# Patient Record
Sex: Female | Born: 1945 | Race: White | Hispanic: No | Marital: Married | Smoking: Former smoker
Health system: Southern US, Community
[De-identification: ages and names within clinical notes are randomized; demographics above are authoritative.]

## PROBLEM LIST (undated history)

## (undated) DIAGNOSIS — M19012 Primary osteoarthritis, left shoulder: Secondary | ICD-10-CM

## (undated) DIAGNOSIS — R110 Nausea: Secondary | ICD-10-CM

## (undated) DIAGNOSIS — R634 Abnormal weight loss: Secondary | ICD-10-CM

## (undated) DIAGNOSIS — R7989 Other specified abnormal findings of blood chemistry: Secondary | ICD-10-CM

## (undated) DIAGNOSIS — S52531A Colles' fracture of right radius, initial encounter for closed fracture: Secondary | ICD-10-CM

## (undated) DIAGNOSIS — D72829 Elevated white blood cell count, unspecified: Secondary | ICD-10-CM

## (undated) DIAGNOSIS — G9340 Encephalopathy, unspecified: Secondary | ICD-10-CM

## (undated) DIAGNOSIS — R8271 Bacteriuria: Secondary | ICD-10-CM

## (undated) DIAGNOSIS — E870 Hyperosmolality and hypernatremia: Secondary | ICD-10-CM

## (undated) DIAGNOSIS — A6920 Lyme disease, unspecified: Secondary | ICD-10-CM

## (undated) DIAGNOSIS — M7582 Other shoulder lesions, left shoulder: Secondary | ICD-10-CM

## (undated) DIAGNOSIS — N179 Acute kidney failure, unspecified: Secondary | ICD-10-CM

## (undated) DIAGNOSIS — F4310 Post-traumatic stress disorder, unspecified: Secondary | ICD-10-CM

## (undated) DIAGNOSIS — IMO0002 Reserved for concepts with insufficient information to code with codable children: Secondary | ICD-10-CM

## (undated) DIAGNOSIS — N949 Unspecified condition associated with female genital organs and menstrual cycle: Secondary | ICD-10-CM

## (undated) DIAGNOSIS — Z4802 Encounter for removal of sutures: Secondary | ICD-10-CM

## (undated) DIAGNOSIS — R197 Diarrhea, unspecified: Secondary | ICD-10-CM

## (undated) DIAGNOSIS — G8929 Other chronic pain: Secondary | ICD-10-CM

## (undated) DIAGNOSIS — F1120 Opioid dependence, uncomplicated: Secondary | ICD-10-CM

## (undated) DIAGNOSIS — IMO0001 Reserved for inherently not codable concepts without codable children: Secondary | ICD-10-CM

## (undated) DIAGNOSIS — F13239 Sedative, hypnotic or anxiolytic dependence with withdrawal, unspecified: Secondary | ICD-10-CM

## (undated) DIAGNOSIS — R102 Pelvic and perineal pain: Secondary | ICD-10-CM

## (undated) HISTORY — DX: Colles' fracture of right radius, initial encounter for closed fracture: S52.531A

## (undated) HISTORY — PX: COLON SURGERY: SHX602

## (undated) HISTORY — DX: Lyme disease, unspecified: A69.20

## (undated) HISTORY — DX: Pelvic and perineal pain: R10.2

## (undated) HISTORY — DX: Elevated white blood cell count, unspecified: D72.829

## (undated) HISTORY — DX: Unspecified condition associated with female genital organs and menstrual cycle: N94.9

## (undated) HISTORY — DX: Encephalopathy, unspecified: G93.40

## (undated) HISTORY — DX: Diarrhea, unspecified: R19.7

## (undated) HISTORY — DX: Other specified abnormal findings of blood chemistry: R79.89

## (undated) HISTORY — DX: Reserved for concepts with insufficient information to code with codable children: IMO0002

## (undated) HISTORY — DX: Bacteriuria: R82.71

## (undated) HISTORY — DX: Abnormal weight loss: R63.4

## (undated) HISTORY — DX: Other shoulder lesions, left shoulder: M75.82

## (undated) HISTORY — PX: APPENDECTOMY: SHX54

## (undated) HISTORY — DX: Opioid dependence, uncomplicated: F11.20

## (undated) HISTORY — DX: Reserved for inherently not codable concepts without codable children: IMO0001

## (undated) HISTORY — DX: Acute kidney failure, unspecified: N17.9

## (undated) HISTORY — DX: Nausea: R11.0

## (undated) HISTORY — DX: Post-traumatic stress disorder, unspecified: F43.10

## (undated) HISTORY — DX: Encounter for removal of sutures: Z48.02

## (undated) HISTORY — DX: Primary osteoarthritis, left shoulder: M19.012

## (undated) HISTORY — DX: Hyperosmolality and hypernatremia: E87.0

## (undated) HISTORY — DX: Other chronic pain: G89.29

## (undated) HISTORY — DX: Sedative, hypnotic or anxiolytic dependence with withdrawal, unspecified: F13.239

---

## 2010-11-01 DIAGNOSIS — N949 Unspecified condition associated with female genital organs and menstrual cycle: Secondary | ICD-10-CM

## 2010-11-01 HISTORY — DX: Unspecified condition associated with female genital organs and menstrual cycle: N94.9

## 2012-12-15 DIAGNOSIS — F1120 Opioid dependence, uncomplicated: Secondary | ICD-10-CM | POA: Insufficient documentation

## 2012-12-15 DIAGNOSIS — A6920 Lyme disease, unspecified: Secondary | ICD-10-CM

## 2012-12-15 DIAGNOSIS — IMO0002 Reserved for concepts with insufficient information to code with codable children: Secondary | ICD-10-CM

## 2012-12-15 HISTORY — DX: Reserved for concepts with insufficient information to code with codable children: IMO0002

## 2012-12-15 HISTORY — DX: Opioid dependence, uncomplicated: F11.20

## 2012-12-15 HISTORY — DX: Lyme disease, unspecified: A69.20

## 2013-03-16 DIAGNOSIS — R634 Abnormal weight loss: Secondary | ICD-10-CM

## 2013-03-16 HISTORY — DX: Abnormal weight loss: R63.4

## 2013-07-01 DIAGNOSIS — IMO0001 Reserved for inherently not codable concepts without codable children: Secondary | ICD-10-CM

## 2013-07-01 HISTORY — DX: Reserved for inherently not codable concepts without codable children: IMO0001

## 2013-07-13 DIAGNOSIS — Z4802 Encounter for removal of sutures: Secondary | ICD-10-CM

## 2013-07-13 HISTORY — DX: Encounter for removal of sutures: Z48.02

## 2014-04-20 DIAGNOSIS — R110 Nausea: Secondary | ICD-10-CM

## 2014-04-20 HISTORY — DX: Nausea: R11.0

## 2014-09-11 DIAGNOSIS — M7582 Other shoulder lesions, left shoulder: Secondary | ICD-10-CM | POA: Insufficient documentation

## 2014-09-11 DIAGNOSIS — M19012 Primary osteoarthritis, left shoulder: Secondary | ICD-10-CM

## 2014-09-11 DIAGNOSIS — M778 Other enthesopathies, not elsewhere classified: Secondary | ICD-10-CM

## 2014-09-11 DIAGNOSIS — S52531A Colles' fracture of right radius, initial encounter for closed fracture: Secondary | ICD-10-CM

## 2014-09-11 HISTORY — DX: Other enthesopathies, not elsewhere classified: M77.8

## 2014-09-11 HISTORY — DX: Colles' fracture of right radius, initial encounter for closed fracture: S52.531A

## 2014-09-11 HISTORY — DX: Primary osteoarthritis, left shoulder: M19.012

## 2016-10-03 ENCOUNTER — Ambulatory Visit
Admission: EM | Admit: 2016-10-03 | Discharge: 2016-10-03 | Disposition: A | Discharge status: Home / Self Care | Payer: Medicare Other | Attending: Family Medicine | Admitting: Family Medicine

## 2016-10-03 ENCOUNTER — Ambulatory Visit (INDEPENDENT_AMBULATORY_CARE_PROVIDER_SITE_OTHER): Payer: Medicare Other

## 2016-10-03 ENCOUNTER — Encounter: Payer: Self-pay | Admitting: *Deleted

## 2016-10-03 DIAGNOSIS — R531 Weakness: Secondary | ICD-10-CM | POA: Diagnosis not present

## 2016-10-03 DIAGNOSIS — R911 Solitary pulmonary nodule: Secondary | ICD-10-CM

## 2016-10-03 NOTE — ED Triage Notes (Signed)
Pt recently dx with pneumonia by her PCP and given Azithromycin which has caused diarrhea. Pt has had multiple abd surgeries which have made bowel movements difficult for her. She is here as her PCP suggested she come here for IV antibiotics.
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
## 2016-10-07 ENCOUNTER — Other Ambulatory Visit: Payer: Self-pay | Admitting: *Deleted

## 2016-10-07 DIAGNOSIS — R9389 Abnormal findings on diagnostic imaging of other specified body structures: Secondary | ICD-10-CM

## 2016-10-23 ENCOUNTER — Ambulatory Visit: Payer: Medicare Other

## 2017-03-27 DIAGNOSIS — D72829 Elevated white blood cell count, unspecified: Secondary | ICD-10-CM

## 2017-03-27 DIAGNOSIS — R8271 Bacteriuria: Secondary | ICD-10-CM | POA: Insufficient documentation

## 2017-03-27 DIAGNOSIS — R102 Pelvic and perineal pain: Secondary | ICD-10-CM

## 2017-03-27 DIAGNOSIS — R778 Other specified abnormalities of plasma proteins: Secondary | ICD-10-CM

## 2017-03-27 DIAGNOSIS — G9340 Encephalopathy, unspecified: Secondary | ICD-10-CM

## 2017-03-27 DIAGNOSIS — R7989 Other specified abnormal findings of blood chemistry: Secondary | ICD-10-CM

## 2017-03-27 DIAGNOSIS — G8929 Other chronic pain: Secondary | ICD-10-CM

## 2017-03-27 DIAGNOSIS — E870 Hyperosmolality and hypernatremia: Secondary | ICD-10-CM

## 2017-03-27 DIAGNOSIS — N179 Acute kidney failure, unspecified: Secondary | ICD-10-CM

## 2017-03-27 HISTORY — DX: Other chronic pain: G89.29

## 2017-03-27 HISTORY — DX: Pelvic and perineal pain: R10.2

## 2017-03-27 HISTORY — DX: Bacteriuria: R82.71

## 2017-03-27 HISTORY — DX: Acute kidney failure, unspecified: N17.9

## 2017-03-27 HISTORY — DX: Other specified abnormalities of plasma proteins: R77.8

## 2017-03-27 HISTORY — DX: Elevated white blood cell count, unspecified: D72.829

## 2017-03-27 HISTORY — DX: Encephalopathy, unspecified: G93.40

## 2017-03-27 HISTORY — DX: Hyperosmolality and hypernatremia: E87.0

## 2017-03-30 DIAGNOSIS — F13939 Sedative, hypnotic or anxiolytic use, unspecified with withdrawal, unspecified: Secondary | ICD-10-CM

## 2017-03-30 DIAGNOSIS — F13239 Sedative, hypnotic or anxiolytic dependence with withdrawal, unspecified: Secondary | ICD-10-CM

## 2017-03-30 HISTORY — DX: Sedative, hypnotic or anxiolytic use, unspecified with withdrawal, unspecified: F13.939

## 2017-03-30 HISTORY — DX: Sedative, hypnotic or anxiolytic dependence with withdrawal, unspecified: F13.239

## 2017-06-15 DIAGNOSIS — R197 Diarrhea, unspecified: Secondary | ICD-10-CM

## 2017-06-15 HISTORY — DX: Diarrhea, unspecified: R19.7

## 2017-12-08 ENCOUNTER — Emergency Department: Payer: Medicare Other

## 2017-12-08 ENCOUNTER — Emergency Department
Admission: EM | Admit: 2017-12-08 | Discharge: 2017-12-08 | Disposition: A | Discharge status: Home / Self Care | Payer: Medicare Other | Attending: Emergency Medicine | Admitting: Emergency Medicine

## 2017-12-08 DIAGNOSIS — Y999 Unspecified external cause status: Secondary | ICD-10-CM | POA: Diagnosis not present

## 2017-12-08 DIAGNOSIS — Y929 Unspecified place or not applicable: Secondary | ICD-10-CM | POA: Diagnosis not present

## 2017-12-08 DIAGNOSIS — G8929 Other chronic pain: Secondary | ICD-10-CM

## 2017-12-08 DIAGNOSIS — Y9389 Activity, other specified: Secondary | ICD-10-CM | POA: Diagnosis not present

## 2017-12-08 DIAGNOSIS — M25512 Pain in left shoulder: Secondary | ICD-10-CM | POA: Insufficient documentation

## 2017-12-08 DIAGNOSIS — S4992XA Unspecified injury of left shoulder and upper arm, initial encounter: Secondary | ICD-10-CM | POA: Diagnosis present

## 2017-12-08 DIAGNOSIS — W108XXA Fall (on) (from) other stairs and steps, initial encounter: Secondary | ICD-10-CM | POA: Diagnosis not present

## 2017-12-08 DIAGNOSIS — W19XXXA Unspecified fall, initial encounter: Secondary | ICD-10-CM

## 2017-12-08 LAB — BASIC METABOLIC PANEL
Anion gap: 7 (ref 5–15)
BUN: 18 mg/dL (ref 8–23)
CHLORIDE: 107 mmol/L (ref 98–111)
CO2: 27 mmol/L (ref 22–32)
Calcium: 8.6 mg/dL — ABNORMAL LOW (ref 8.9–10.3)
Creatinine, Ser: 0.87 mg/dL (ref 0.44–1.00)
GFR calc Af Amer: >60 mL/min (ref >60)
GLUCOSE: 99 mg/dL (ref 70–99)
Potassium: 4.6 mmol/L (ref 3.5–5.1)
Sodium: 141 mmol/L (ref 135–145)

## 2017-12-08 LAB — ETHANOL

## 2017-12-08 LAB — CBC WITH DIFFERENTIAL/PLATELET
Basophils Absolute: 0 10*3/uL (ref 0–0.1)
Basophils Relative: 1 %
EOS ABS: 0.2 10*3/uL (ref 0–0.7)
Eosinophils Relative: 3 %
HCT: 38.1 % (ref 35.0–47.0)
HEMOGLOBIN: 13.1 g/dL (ref 12.0–16.0)
LYMPHS ABS: 1.4 10*3/uL (ref 1.0–3.6)
Lymphocytes Relative: 19 %
MCH: 31.1 pg (ref 26.0–34.0)
MCHC: 34.3 g/dL (ref 32.0–36.0)
MCV: 90.7 fL (ref 80.0–100.0)
MONOS PCT: 8 %
Monocytes Absolute: 0.5 10*3/uL (ref 0.2–0.9)
NEUTROS PCT: 69 %
Neutro Abs: 5.2 10*3/uL (ref 1.4–6.5)
Platelets: 239 10*3/uL (ref 150–440)
RBC: 4.2 MIL/uL (ref 3.80–5.20)
RDW: 13.9 % (ref 11.5–14.5)
WBC: 7.4 10*3/uL (ref 3.6–11.0)

## 2017-12-08 LAB — SALICYLATE LEVEL: Salicylate Lvl: <7 mg/dL (ref 2.8–30.0)

## 2017-12-08 LAB — ACETAMINOPHEN LEVEL: Acetaminophen (Tylenol), Serum: <10 ug/mL — ABNORMAL LOW (ref 10–30)

## 2017-12-08 LAB — TROPONIN I

## 2017-12-08 MED ORDER — ONDANSETRON HCL 4 MG/2ML IJ SOLN
4.0000 mg | Freq: Once | INTRAMUSCULAR | Status: AC
  Administered 2017-12-08: 4 mg via INTRAVENOUS
  Filled 2017-12-08: qty 2

## 2017-12-08 MED ORDER — MORPHINE SULFATE (PF) 4 MG/ML IV SOLN
4.0000 mg | Freq: Once | INTRAVENOUS | Status: AC
  Administered 2017-12-08: 4 mg via INTRAVENOUS
  Filled 2017-12-08: qty 1

## 2017-12-08 NOTE — ED Notes (Signed)
Per Dr. Pershing Proud pt does not need to be dressed out with sitter at this time.

## 2017-12-08 NOTE — ED Triage Notes (Signed)
Pt brought to ED via EMS. Pt reports falling on Friday. States she popped her left shoulder back into place herself. Pt now complaining of left shoulder and back pain. Pt normally ambulatory and lives at home alone. EMS vitals 148/95 HR 108 96%RA.

## 2017-12-08 NOTE — ED Provider Notes (Signed)
[facility] Emergency Department Provider Note ____________________________________________   First MD Initiated Contact with Patient 12/08/17 1024     (approximate)  I have reviewed the triage vital signs and the nursing notes.   HISTORY  Chief Complaint Fall  HPI Sierra Dickerson is a 72 y.o. female with a history of left shoulder dislocation and subsequent surgery who was presented to the emergency department after having fallen this past Friday night, 5 days ago.  She says that she tripped and fell down 15 stairs.  She says that she thinks she dislocated her shoulder at that time was able to pop it back in.  Denies any headache or neck pain at this time.  However, is complaining of her generalized chronic pain.  Says that she takes an oxycodone at home without relief and came to the emergency department today for worsening pain to her left shoulder and increasing immobility to the left shoulder.  Says that she lives alone.  Says that she also has fleeting thoughts of suicide but no plan.   No past medical history on file.  There are no active problems to display for this patient.   Past Surgical History:  Procedure Laterality Date  . APPENDECTOMY    . COLON SURGERY      Prior to Admission medications   Not on File    Allergies Nsaids  No family history on file.  Social History Social History   Tobacco Use  . Smoking status: Never Smoker  . Smokeless tobacco: Never Used  Substance Use Topics  . Alcohol use: Yes  . Drug use: No    Review of Systems  Constitutional: No fever/chills Eyes: No visual changes. ENT: No sore throat. Cardiovascular: Denies chest pain. Respiratory: Denies shortness of breath. Gastrointestinal: No abdominal pain.  No nausea, no vomiting.  No diarrhea.  No constipation. Genitourinary: Negative for dysuria. Musculoskeletal: Negative for back pain. Skin: Negative for rash. Neurological: Negative for headaches,  focal weakness or numbness.   ____________________________________________   PHYSICAL EXAM:  VITAL SIGNS: ED Triage Vitals  Enc Vitals Group     BP 12/08/17 1018 (!) 144/56     Pulse Rate 12/08/17 1018 69     Resp 12/08/17 1018 18     Temp 12/08/17 1018 97.7 F (36.5 C)     Temp Source 12/08/17 1018 Oral     SpO2 12/08/17 1018 98 %     Weight --      Height --      Head Circumference --      Peak Flow --      Pain Score 12/08/17 1019 9     Pain Loc --      Pain Edu? --      Excl. in GC? --     Constitutional: Alert and oriented.  Patient appears uncomfortable, lying on her right side.  Left upper extremity is held in adduction with internal rotation. Eyes: Conjunctivae are normal.  Head: Atraumatic. Nose: No congestion/rhinnorhea. Mouth/Throat: Mucous membranes are moist.  Neck: No stridor.   Cardiovascular: Normal rate, regular rhythm. Grossly normal heart sounds.  Good peripheral circulation with equal bilateral radial pulses. Respiratory: Normal respiratory effort.  No retractions. Lungs CTAB. Gastrointestinal: Soft and nontender. No distention. No CVA tenderness. Musculoskeletal: No lower extremity tenderness nor edema.  No joint effusions. Able to passively range left upper extremity/shoulder.  No issues with range of motion of the left elbow but patient with pain with passive range of motion  of the left shoulder.  No deformity visualized.  No tenderness to palpation.  No ecchymosis.  Patient with intact sensation to light touch over the left deltoid. Neurologic:  Normal speech and language. No gross focal neurologic deficits are appreciated. Skin:  Skin is warm, dry and intact. No rash noted. Psychiatric: Mood and affect are normal. Speech and behavior are normal.  ____________________________________________   LABS (all labs ordered are listed, but only abnormal results are displayed)  Labs Reviewed  BASIC METABOLIC PANEL - Abnormal; Notable for the following  components:      Result Value   Calcium 8.6 (*)    All other components within normal limits  ACETAMINOPHEN LEVEL - Abnormal; Notable for the following components:   Acetaminophen (Tylenol), Serum <10 (*)    All other components within normal limits  CBC WITH DIFFERENTIAL/PLATELET  TROPONIN I  SALICYLATE LEVEL  ETHANOL  URINALYSIS, COMPLETE (UACMP) WITH MICROSCOPIC  URINE DRUG SCREEN, QUALITATIVE ([facility] ONLY)   ____________________________________________  EKG  ED ECG REPORT I, Arelia Longest, the attending physician, personally viewed and interpreted this ECG.   Date: 12/08/2017  EKG Time: 1115  Rate: 63  Rhythm: normal sinus rhythm  Axis: Normal  Intervals:nonspecific intraventricular conduction delay  ST&T Change: No ST segment elevation or depression.  T wave inversions in 3, aVF with biphasic T waves in V5 and V6. No previous EKG for comparison. ____________________________________________  RADIOLOGY  Left shoulder without any acute findings.  Chest x-ray without any acute disease. ____________________________________________   PROCEDURES  Procedure(s) performed:   Procedures  Critical Care performed:   ____________________________________________   INITIAL IMPRESSION / ASSESSMENT AND PLAN / ED COURSE  Pertinent labs & imaging results that were available during my care of the patient were reviewed by me and considered in my medical decision making (see chart for details).  DDX: Chronic pain, depression, suicidal ideation, chronic left shoulder pain, injury from fall, contusion As part of my medical decision making, I reviewed the following data within the electronic MEDICAL RECORD NUMBERReviewed notes from prior outpatient visits  ----------------------------------------- 12:20 PM on 12/08/2017 -----------------------------------------  Patient at this time is sitting at the side of the bed.  No distress.  Saying that she would like to be discharged home.   I asked her again about her depression and possible fleeting suicidal thoughts and she says that she is feels fine right now but sometimes gets upset about her chronic pain but does not have any intention or plans to harm herself or kill herself or anybody else.  She says that she feels confident about being discharged and following up with her primary care doctor.  Will be discharged at this time.  We reviewed her labs as well as imaging studies. ____________________________________________   FINAL CLINICAL IMPRESSION(S) / ED DIAGNOSES  Fall.  Chronic pain.  NEW MEDICATIONS STARTED DURING THIS VISIT:  New Prescriptions   No medications on file     Note:  This document was prepared using Dragon voice recognition software and may include unintentional dictation errors.     Myrna Blazer, MD 12/08/17 [phone]

## 2017-12-08 NOTE — ED Notes (Signed)
Pt refusing to sign ED discharge note. Pt in hallway stating she is leaving now. Was able to talk patient into returning to room for doctor to see and handed pt discharge paperwork and reviewed prior to discharge. Per Dr. Pershing Proud pt is allowed to take a cab home. Pt did not drive here today.

## 2018-03-10 DIAGNOSIS — F4310 Post-traumatic stress disorder, unspecified: Secondary | ICD-10-CM

## 2018-03-10 HISTORY — DX: Post-traumatic stress disorder, unspecified: F43.10

## 2018-06-04 ENCOUNTER — Encounter: Payer: Self-pay | Admitting: Urology

## 2018-06-04 ENCOUNTER — Other Ambulatory Visit
Admission: RE | Admit: 2018-06-04 | Discharge: 2018-06-04 | Disposition: A | Discharge status: Home / Self Care | Payer: Medicare Other | Attending: Urology | Admitting: Urology

## 2018-06-04 ENCOUNTER — Ambulatory Visit: Payer: Medicare Other | Admitting: Urology

## 2018-06-04 ENCOUNTER — Telehealth: Payer: Self-pay | Admitting: Urology

## 2018-06-04 VITALS — BP 143/91 | HR 80 | Ht 62.5 in | Wt 130.0 lb

## 2018-06-04 DIAGNOSIS — N369 Urethral disorder, unspecified: Secondary | ICD-10-CM | POA: Diagnosis not present

## 2018-06-04 DIAGNOSIS — R102 Pelvic and perineal pain: Secondary | ICD-10-CM

## 2018-06-04 DIAGNOSIS — R3129 Other microscopic hematuria: Secondary | ICD-10-CM

## 2018-06-04 DIAGNOSIS — N361 Urethral diverticulum: Secondary | ICD-10-CM | POA: Insufficient documentation

## 2018-06-04 LAB — URINALYSIS, COMPLETE (UACMP) WITH MICROSCOPIC
Bacteria, UA: NONE SEEN
Bilirubin Urine: NEGATIVE
Glucose, UA: NEGATIVE mg/dL
Ketones, ur: NEGATIVE mg/dL
Leukocytes,Ua: NEGATIVE
Nitrite: NEGATIVE
Protein, ur: NEGATIVE mg/dL
SPECIFIC GRAVITY, URINE: 1.015 (ref 1.005–1.030)
WBC UA: NONE SEEN WBC/hpf (ref 0–5)
pH: 7 (ref 5.0–8.0)

## 2018-06-04 NOTE — Telephone Encounter (Signed)
Pt called very upset and stated that she seen Dr Sierra Dickerson and she doesn't understand what Dr Sierra Dickerson told her today. She is requesting that someone call her and also send her some instructions through the mail. She stated she did not get an AVS.
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
## 2018-06-07 NOTE — Telephone Encounter (Signed)
Called patient and went over visit details, office note was mailed to patient for review

## 2018-06-10 ENCOUNTER — Other Ambulatory Visit: Payer: Self-pay

## 2018-06-10 DIAGNOSIS — R3129 Other microscopic hematuria: Secondary | ICD-10-CM

## 2018-06-10 DIAGNOSIS — N369 Urethral disorder, unspecified: Secondary | ICD-10-CM

## 2018-06-10 DIAGNOSIS — R102 Pelvic and perineal pain: Secondary | ICD-10-CM

## 2018-06-15 ENCOUNTER — Ambulatory Visit
Admission: RE | Admit: 2018-06-15 | Discharge: 2018-06-15 | Disposition: A | Discharge status: Home / Self Care | Payer: Medicare Other | Source: Ambulatory Visit | Attending: Urology | Admitting: Urology

## 2018-06-15 ENCOUNTER — Telehealth: Payer: Self-pay

## 2018-06-15 ENCOUNTER — Other Ambulatory Visit: Payer: Self-pay

## 2018-06-15 ENCOUNTER — Other Ambulatory Visit
Admission: RE | Admit: 2018-06-15 | Discharge: 2018-06-15 | Disposition: A | Discharge status: Home / Self Care | Payer: Medicare Other | Source: Home / Self Care | Attending: Urology | Admitting: Urology

## 2018-06-15 DIAGNOSIS — N369 Urethral disorder, unspecified: Secondary | ICD-10-CM | POA: Insufficient documentation

## 2018-06-15 DIAGNOSIS — R102 Pelvic and perineal pain: Secondary | ICD-10-CM

## 2018-06-15 DIAGNOSIS — R3129 Other microscopic hematuria: Secondary | ICD-10-CM

## 2018-06-15 LAB — CREATININE, SERUM: Creatinine, Ser: 0.86 mg/dL (ref 0.44–1.00)

## 2018-06-15 MED ORDER — GADOBUTROL 1 MMOL/ML IV SOLN
5.0000 mL | Freq: Once | INTRAVENOUS | Status: AC | PRN
  Administered 2018-06-15: 5 mL via INTRAVENOUS

## 2018-06-15 NOTE — Telephone Encounter (Signed)
Left pt mess to call 

## 2018-06-15 NOTE — Telephone Encounter (Signed)
Patient notified

## 2018-06-15 NOTE — Addendum Note (Signed)
Addended by: Caroline Sauger on: 06/15/2018 08:19 AM   Modules accepted: Orders

## 2018-06-15 NOTE — Telephone Encounter (Signed)
-----   Message from Vanna Scotland, MD sent at 06/15/2018  1:44 PM EDT ----- Please let this patient know that I have reviewed her MRI.  There is no evidence of a urethral cyst or diverticulum (outpouching of the urethra).  Recommend following up with the urologist that I recommended at [facility], Dr. Nino Parsley for further assessment.  Vanna Scotland, MD

## 2018-06-25 ENCOUNTER — Ambulatory Visit: Payer: Self-pay | Admitting: Urology

## 2018-07-14 ENCOUNTER — Telehealth: Payer: Self-pay | Admitting: *Deleted

## 2018-07-14 NOTE — Telephone Encounter (Signed)
FYI-Received fax from [facility] regarding referral-patient did not wish to undergo evaluation at their office.

## 2019-01-15 ENCOUNTER — Other Ambulatory Visit: Payer: Self-pay

## 2019-01-15 ENCOUNTER — Emergency Department
Admission: EM | Admit: 2019-01-15 | Discharge: 2019-01-15 | Disposition: A | Discharge status: Home / Self Care | Payer: Medicare Other | Attending: Student in an Organized Health Care Education/Training Program | Admitting: Student in an Organized Health Care Education/Training Program

## 2019-01-15 ENCOUNTER — Encounter: Payer: Self-pay | Admitting: Emergency Medicine

## 2019-01-15 DIAGNOSIS — Z87891 Personal history of nicotine dependence: Secondary | ICD-10-CM | POA: Insufficient documentation

## 2019-01-15 DIAGNOSIS — Z79899 Other long term (current) drug therapy: Secondary | ICD-10-CM | POA: Diagnosis not present

## 2019-01-15 DIAGNOSIS — R6889 Other general symptoms and signs: Secondary | ICD-10-CM

## 2019-01-15 DIAGNOSIS — R7989 Other specified abnormal findings of blood chemistry: Secondary | ICD-10-CM | POA: Insufficient documentation

## 2019-01-15 LAB — CBC WITH DIFFERENTIAL/PLATELET
Abs Immature Granulocytes: 0.02 10*3/uL (ref 0.00–0.07)
Basophils Absolute: 0.1 10*3/uL (ref 0.0–0.1)
Basophils Relative: 1 %
Eosinophils Absolute: 0.1 10*3/uL (ref 0.0–0.5)
Eosinophils Relative: 1 %
HCT: 37.9 % (ref 36.0–46.0)
Hemoglobin: 12.6 g/dL (ref 12.0–15.0)
Immature Granulocytes: 0 %
Lymphocytes Relative: 19 %
Lymphs Abs: 1.7 10*3/uL (ref 0.7–4.0)
MCH: 30.3 pg (ref 26.0–34.0)
MCHC: 33.2 g/dL (ref 30.0–36.0)
MCV: 91.1 fL (ref 80.0–100.0)
Monocytes Absolute: 0.5 10*3/uL (ref 0.1–1.0)
Monocytes Relative: 5 %
Neutro Abs: 6.7 10*3/uL (ref 1.7–7.7)
Neutrophils Relative %: 74 %
Platelets: 246 10*3/uL (ref 150–400)
RBC: 4.16 MIL/uL (ref 3.87–5.11)
RDW: 11.9 % (ref 11.5–15.5)
WBC: 9.1 10*3/uL (ref 4.0–10.5)
nRBC: 0 % (ref 0.0–0.2)

## 2019-01-15 LAB — COMPREHENSIVE METABOLIC PANEL
ALT: 15 U/L (ref 0–44)
AST: 22 U/L (ref 15–41)
Albumin: 4 g/dL (ref 3.5–5.0)
Alkaline Phosphatase: 66 U/L (ref 38–126)
Anion gap: 9 (ref 5–15)
BUN: 24 mg/dL — ABNORMAL HIGH (ref 8–23)
CO2: 23 mmol/L (ref 22–32)
Calcium: 9.3 mg/dL (ref 8.9–10.3)
Chloride: 109 mmol/L (ref 98–111)
Creatinine, Ser: 0.79 mg/dL (ref 0.44–1.00)
GFR calc Af Amer: >60 mL/min (ref >60)
GFR calc non Af Amer: >60 mL/min (ref >60)
Glucose, Bld: 110 mg/dL — ABNORMAL HIGH (ref 70–99)
Potassium: 3.9 mmol/L (ref 3.5–5.1)
Sodium: 141 mmol/L (ref 135–145)
Total Bilirubin: 0.6 mg/dL (ref 0.3–1.2)
Total Protein: 7.4 g/dL (ref 6.5–8.1)

## 2019-01-15 LAB — TSH: TSH: 1.778 u[IU]/mL (ref 0.350–4.500)

## 2019-01-15 LAB — T4, FREE: Free T4: 0.85 ng/dL (ref 0.61–1.12)

## 2019-01-15 NOTE — ED Notes (Signed)
Spoke with Dr. Joan Mayans regarding pt. Verbal orders given for CBC, CMP, TSH, and free T4

## 2019-01-15 NOTE — Discharge Instructions (Addendum)
Please follow-up with PCP.  Discuss with your herbalist pharmacist your diet and medications which may contain excessive iodine.  Return to the ER for any additional questions or concerns.

## 2019-01-15 NOTE — ED Triage Notes (Signed)
Pt to ED via [facility] from home for abnormal lab results related to her thyroid. Pt states that her PCP ordered lab work last month and her iodine was 466. Pt states that she does not have an endocrinologist but states that her PCP told her that she needed to come in and be stabalized right away. Pt states that is having extreme fatigue, her hair is falling out. Pt states that she is gaining a lot of weight but she is not eating. Pt is currently in NAD.

## 2019-01-15 NOTE — ED Notes (Signed)
E-signature pad not working. Patient discharged in wheelchair to lobby to call for a ride.
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
## 2020-08-11 IMAGING — MR MRI PELVIS WITHOUT AND WITH CONTRAST
6 of 8 series · 17 of 48 positions shown · IV contrast (gadavist)
Comparison: None.

CLINICAL DATA: Difficulty emptying bladder. Clinical concern for
urethral diverticulum.

EXAM:
MRI PELVIS WITHOUT AND WITH CONTRAST
TECHNIQUE: Multiplanar multisequence MR imaging of the pelvis was performed
both before and after administration of intravenous contrast.
CONTRAST:  5 cc Gadavist

[Series 2: T2 · axial · 5.0mm · 0.55mm/px · z∈[-190,+44]mm · 3 of 40 slices shown (1 of 2)]
[im 1/40]
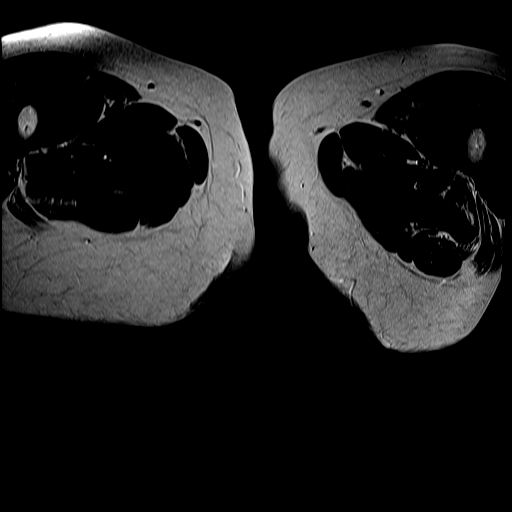
[im 20/40]
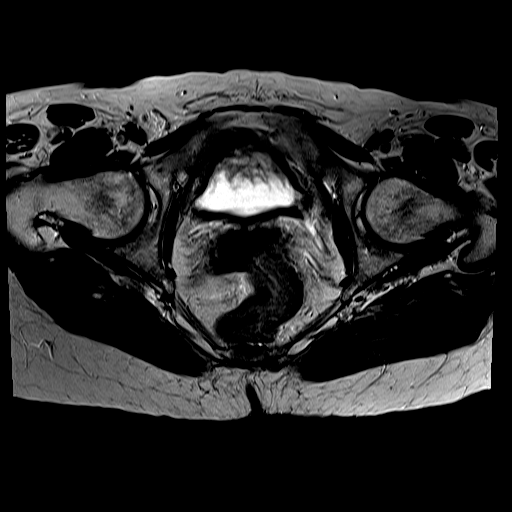
[im 40/40]
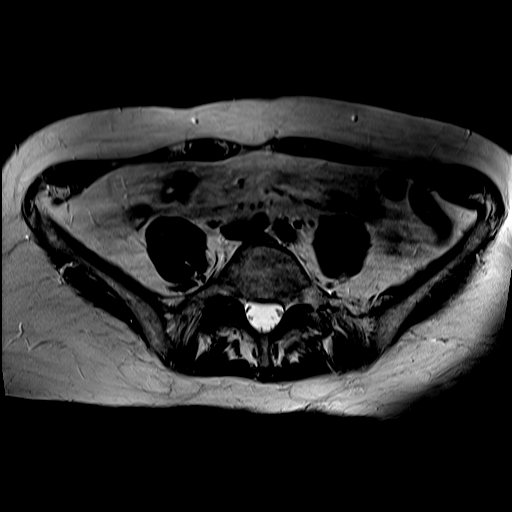

[Series 3: T2 · sagittal · 5.0mm · 0.55mm/px · 3 of 33 slices shown (2 of 2)]
[im 1/33]
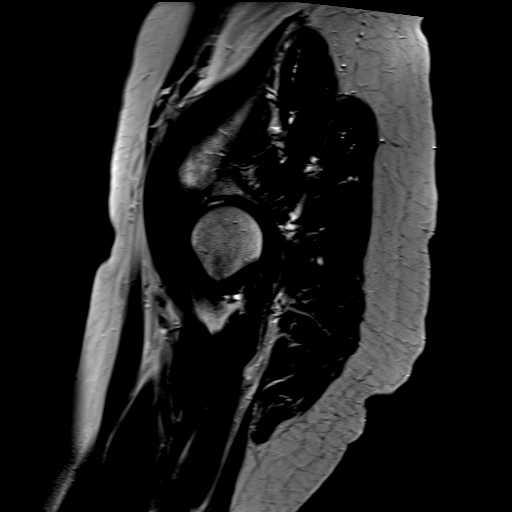
[im 17/33]
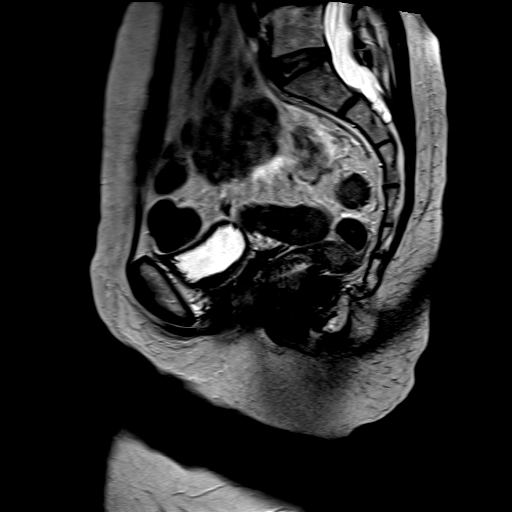
[im 33/33]
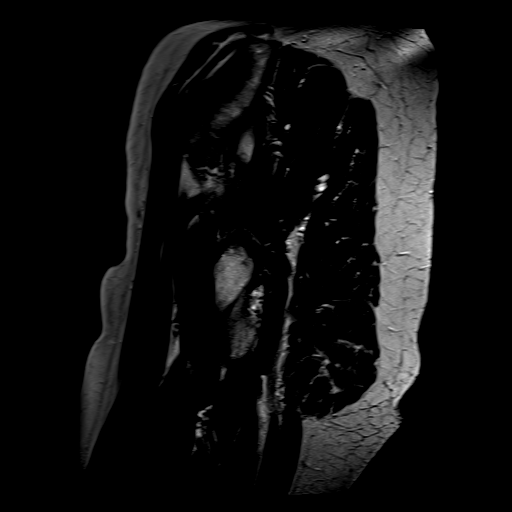

[Series 5: T2 fat-sat · axial · 4.0mm · 0.47mm/px · z∈[-158,-34]mm · 3 of 32 slices shown (1 of 3)]
[im 1/32]
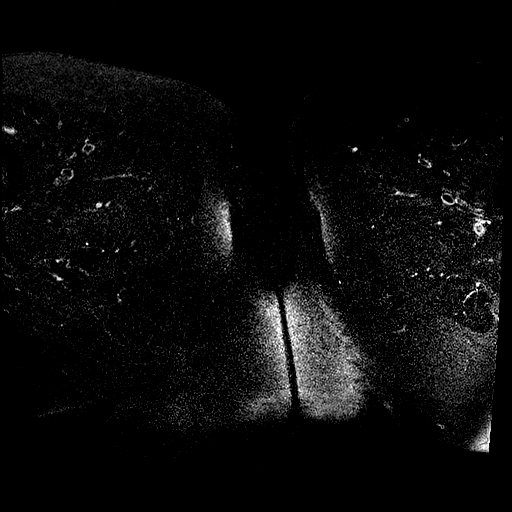
[im 16/32]
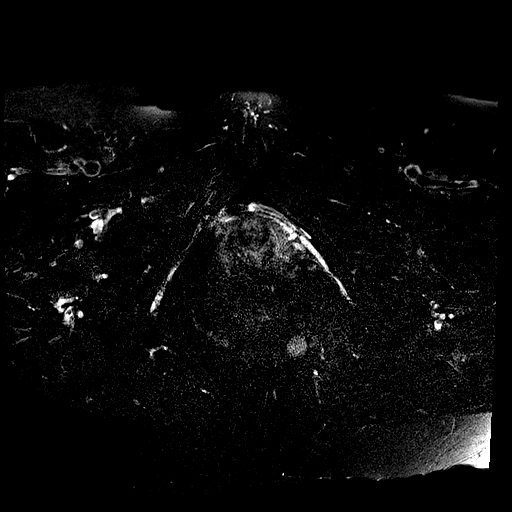
[im 32/32]
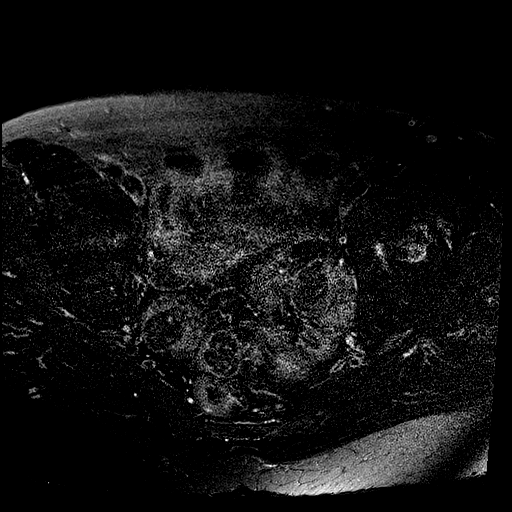

[Series 6: T2 fat-sat · sagittal · 4.0mm · 0.47mm/px · 3 of 37 slices shown (2 of 3)]
[im 1/37]
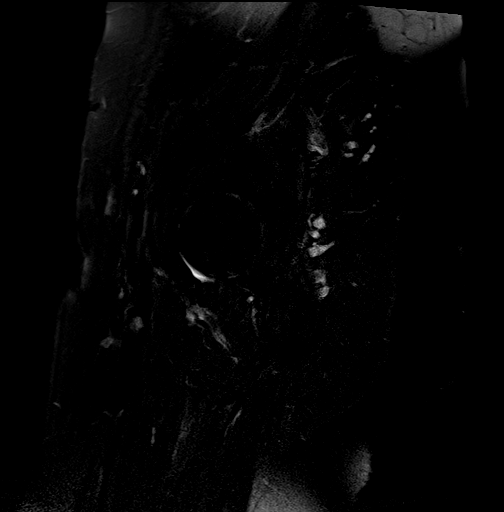
[im 19/37]
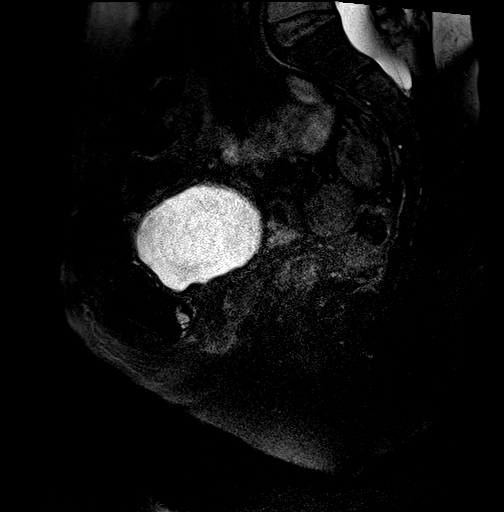
[im 37/37]
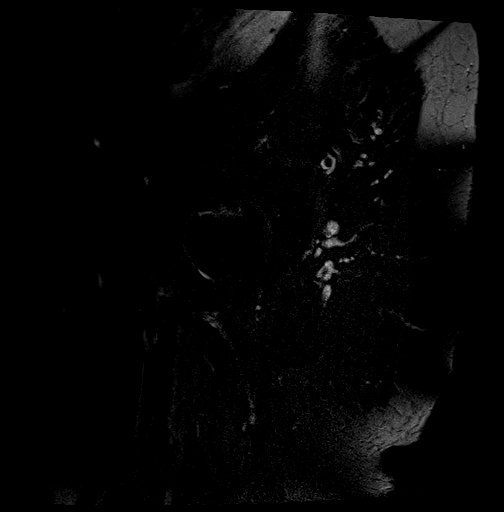

[Series 7: T2 fat-sat · coronal · 4.0mm · 0.47mm/px · 3 of 34 slices shown (3 of 3)]
[im 1/34]
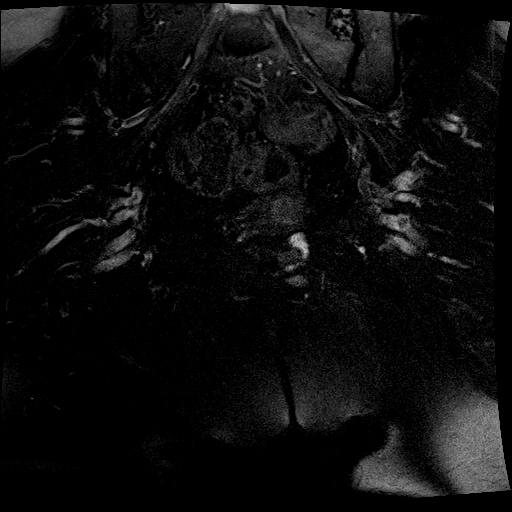
[im 17/34]
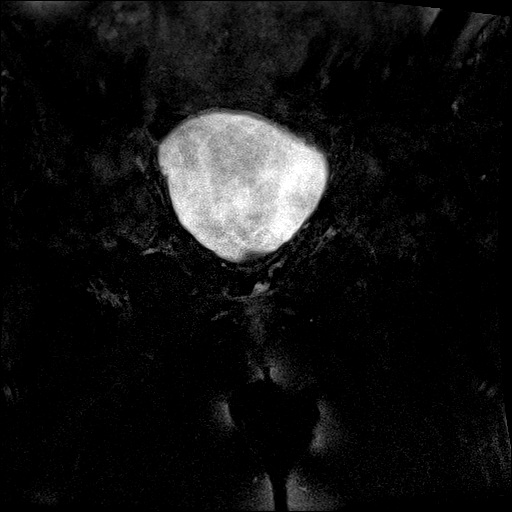
[im 34/34]
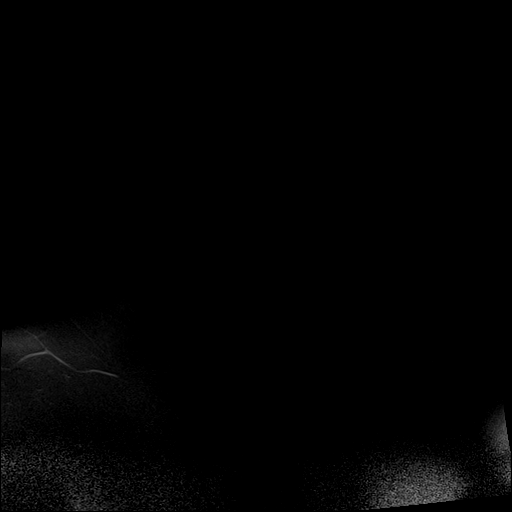

[Series 8: T1 · axial · non-contrast · 4.0mm · 0.34mm/px · z∈[-180,-136]mm · 2 of 114 slices shown]
[im 1/114]
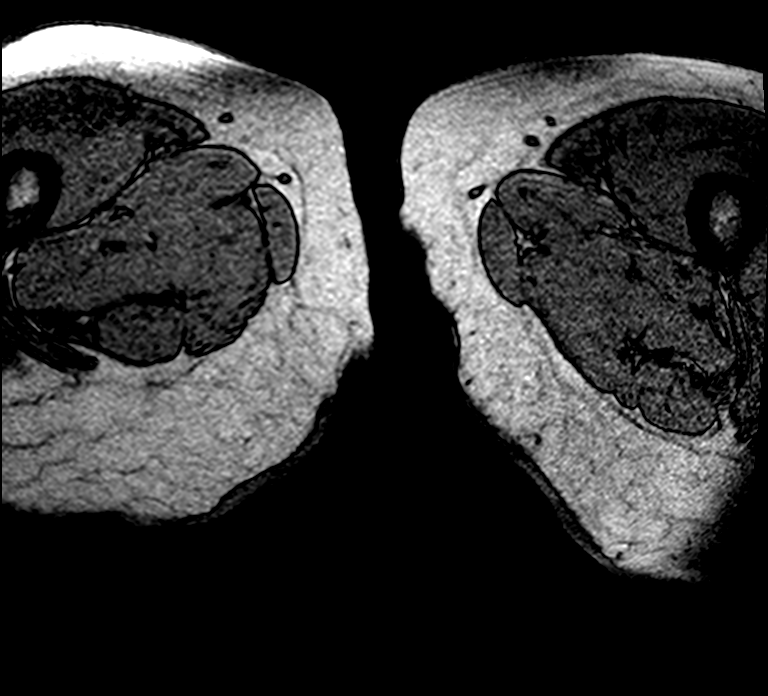
[im 23/114]
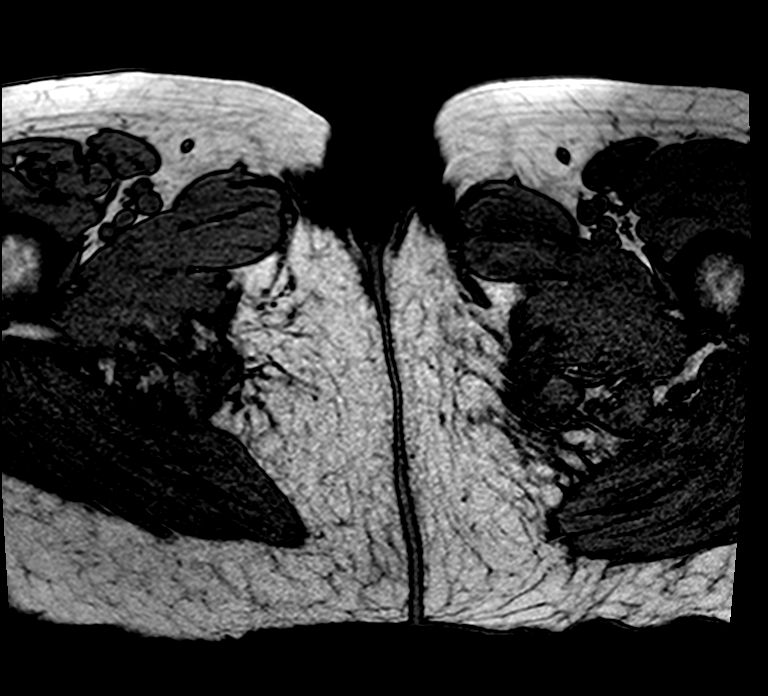

[17 of 48 positions shown; findings below may reference images not displayed]

FINDINGS: Urinary Tract: Bladder is unremarkable in appearance. The urethra is
normal. Specifically, no evidence for urethral diverticulum.

Bowel: No small bowel or colonic dilatation within the visualized
abdomen.

Vascular/Lymphatic: Normal flow signal within the major arterial and
venous anatomy of the pelvic sidewalls.

Reproductive: Uterus measures 1.6 x 5.3 x 3.0 cm (13 cc). No MR
evidence for endometrial stripe thickening.

Neither ovary well seen by MRI, but there is no adnexal mass.

Other:  No intraperitoneal free fluid.

Musculoskeletal: No abnormal marrow enhancement within the
visualized bony anatomy.
IMPRESSION: 1. Unremarkable urinary bladder and urethra. No bladder diverticulum
evident.
2. No acute findings in the pelvis.

## 2021-10-15 ENCOUNTER — Observation Stay: Payer: Medicare PPO

## 2021-10-15 ENCOUNTER — Emergency Department: Payer: Medicare PPO

## 2021-10-15 ENCOUNTER — Other Ambulatory Visit: Payer: Self-pay

## 2021-10-15 ENCOUNTER — Observation Stay
Admission: EM | Admit: 2021-10-15 | Discharge: 2021-10-17 | Disposition: A | Discharge status: Home Health | Payer: Medicare PPO | Attending: Internal Medicine | Admitting: Internal Medicine

## 2021-10-15 DIAGNOSIS — I160 Hypertensive urgency: Secondary | ICD-10-CM | POA: Diagnosis not present

## 2021-10-15 DIAGNOSIS — N1831 Chronic kidney disease, stage 3a: Secondary | ICD-10-CM | POA: Diagnosis not present

## 2021-10-15 DIAGNOSIS — I10 Essential (primary) hypertension: Secondary | ICD-10-CM

## 2021-10-15 DIAGNOSIS — R001 Bradycardia, unspecified: Secondary | ICD-10-CM | POA: Diagnosis not present

## 2021-10-15 DIAGNOSIS — M7989 Other specified soft tissue disorders: Secondary | ICD-10-CM | POA: Insufficient documentation

## 2021-10-15 DIAGNOSIS — I5032 Chronic diastolic (congestive) heart failure: Secondary | ICD-10-CM | POA: Diagnosis not present

## 2021-10-15 DIAGNOSIS — R2681 Unsteadiness on feet: Secondary | ICD-10-CM

## 2021-10-15 DIAGNOSIS — R531 Weakness: Secondary | ICD-10-CM | POA: Diagnosis present

## 2021-10-15 DIAGNOSIS — H532 Diplopia: Secondary | ICD-10-CM | POA: Insufficient documentation

## 2021-10-15 DIAGNOSIS — Z79899 Other long term (current) drug therapy: Secondary | ICD-10-CM | POA: Diagnosis not present

## 2021-10-15 DIAGNOSIS — Z87891 Personal history of nicotine dependence: Secondary | ICD-10-CM | POA: Insufficient documentation

## 2021-10-15 DIAGNOSIS — G4700 Insomnia, unspecified: Secondary | ICD-10-CM | POA: Insufficient documentation

## 2021-10-15 DIAGNOSIS — Z20822 Contact with and (suspected) exposure to covid-19: Secondary | ICD-10-CM | POA: Insufficient documentation

## 2021-10-15 DIAGNOSIS — I129 Hypertensive chronic kidney disease with stage 1 through stage 4 chronic kidney disease, or unspecified chronic kidney disease: Secondary | ICD-10-CM | POA: Insufficient documentation

## 2021-10-15 DIAGNOSIS — F4310 Post-traumatic stress disorder, unspecified: Secondary | ICD-10-CM | POA: Diagnosis present

## 2021-10-15 LAB — URINALYSIS, ROUTINE W REFLEX MICROSCOPIC
Bacteria, UA: NONE SEEN
Bilirubin Urine: NEGATIVE
Glucose, UA: NEGATIVE mg/dL
Ketones, ur: NEGATIVE mg/dL
Leukocytes,Ua: NEGATIVE
Nitrite: NEGATIVE
Protein, ur: NEGATIVE mg/dL
Specific Gravity, Urine: 1.008 (ref 1.005–1.030)
pH: 6 (ref 5.0–8.0)

## 2021-10-15 LAB — HEPATIC FUNCTION PANEL
ALT: 17 U/L (ref 0–44)
AST: 25 U/L (ref 15–41)
Albumin: 3.7 g/dL (ref 3.5–5.0)
Alkaline Phosphatase: 73 U/L (ref 38–126)
Bilirubin, Direct: <0.1 mg/dL (ref 0.0–0.2)
Total Bilirubin: 0.5 mg/dL (ref 0.3–1.2)
Total Protein: 6.9 g/dL (ref 6.5–8.1)

## 2021-10-15 LAB — CBC
HCT: 39.1 % (ref 36.0–46.0)
Hemoglobin: 12.7 g/dL (ref 12.0–15.0)
MCH: 30.2 pg (ref 26.0–34.0)
MCHC: 32.5 g/dL (ref 30.0–36.0)
MCV: 92.9 fL (ref 80.0–100.0)
Platelets: 253 10*3/uL (ref 150–400)
RBC: 4.21 MIL/uL (ref 3.87–5.11)
RDW: 13.2 % (ref 11.5–15.5)
WBC: 5.7 10*3/uL (ref 4.0–10.5)
nRBC: 0 % (ref 0.0–0.2)

## 2021-10-15 LAB — PROCALCITONIN: Procalcitonin: <0.1 ng/mL

## 2021-10-15 LAB — TSH: TSH: 3.215 u[IU]/mL (ref 0.350–4.500)

## 2021-10-15 LAB — BASIC METABOLIC PANEL
Anion gap: 6 (ref 5–15)
BUN: 16 mg/dL (ref 8–23)
CO2: 26 mmol/L (ref 22–32)
Calcium: 9 mg/dL (ref 8.9–10.3)
Chloride: 108 mmol/L (ref 98–111)
Creatinine, Ser: 1.01 mg/dL — ABNORMAL HIGH (ref 0.44–1.00)
GFR, Estimated: 58 mL/min — ABNORMAL LOW (ref >60)
Glucose, Bld: 108 mg/dL — ABNORMAL HIGH (ref 70–99)
Potassium: 4.3 mmol/L (ref 3.5–5.1)
Sodium: 140 mmol/L (ref 135–145)

## 2021-10-15 LAB — SARS CORONAVIRUS 2 BY RT PCR: SARS Coronavirus 2 by RT PCR: NEGATIVE

## 2021-10-15 LAB — BRAIN NATRIURETIC PEPTIDE: B Natriuretic Peptide: 237 pg/mL — ABNORMAL HIGH (ref 0.0–100.0)

## 2021-10-15 LAB — TROPONIN I (HIGH SENSITIVITY): Troponin I (High Sensitivity): 6 ng/L (ref <18)

## 2021-10-15 MED ORDER — LORAZEPAM 0.5 MG PO TABS: 0.5000 mg | ORAL_TABLET | Freq: Four times a day (QID) | ORAL | Status: DC | PRN

## 2021-10-15 MED ORDER — GADOBUTROL 1 MMOL/ML IV SOLN
6.0000 mL | Freq: Once | INTRAVENOUS | Status: AC | PRN
Start: 2021-10-15 — End: 2021-10-15
  Administered 2021-10-15: 6 mL via INTRAVENOUS

## 2021-10-15 MED ORDER — MELATONIN 5 MG PO TABS
5.0000 mg | ORAL_TABLET | Freq: Every evening | ORAL | Status: DC | PRN
Start: 2021-10-15 — End: 2021-10-17

## 2021-10-15 MED ORDER — ONDANSETRON HCL 4 MG PO TABS: 4.0000 mg | ORAL_TABLET | Freq: Four times a day (QID) | ORAL | Status: DC | PRN

## 2021-10-15 MED ORDER — ACETAMINOPHEN 325 MG PO TABS
650.0000 mg | ORAL_TABLET | Freq: Four times a day (QID) | ORAL | Status: DC | PRN
  Administered 2021-10-16 – 2021-10-17 (×2): 650 mg via ORAL
  Filled 2021-10-15 (×2): qty 2

## 2021-10-15 MED ORDER — CLONIDINE HCL 0.1 MG PO TABS
0.1000 mg | ORAL_TABLET | Freq: Once | ORAL | Status: AC
  Administered 2021-10-15: 0.1 mg via ORAL
  Filled 2021-10-15: qty 1

## 2021-10-15 MED ORDER — ADULT MULTIVITAMIN W/MINERALS CH
1.0000 | ORAL_TABLET | Freq: Every day | ORAL | Status: DC
  Administered 2021-10-16 – 2021-10-17 (×2): 1 via ORAL
  Filled 2021-10-15 (×2): qty 1

## 2021-10-15 MED ORDER — ONDANSETRON HCL 4 MG/2ML IJ SOLN: 4.0000 mg | Freq: Four times a day (QID) | INTRAMUSCULAR | Status: DC | PRN

## 2021-10-15 MED ORDER — PANTOPRAZOLE SODIUM 40 MG PO TBEC: 40.0000 mg | DELAYED_RELEASE_TABLET | Freq: Two times a day (BID) | ORAL | Status: DC | PRN

## 2021-10-15 MED ORDER — ENOXAPARIN SODIUM 40 MG/0.4ML IJ SOSY
40.0000 mg | PREFILLED_SYRINGE | Freq: Every day | INTRAMUSCULAR | Status: DC
Start: 2021-10-15 — End: 2021-10-17
  Filled 2021-10-15: qty 0.4

## 2021-10-15 MED ORDER — HYDRALAZINE HCL 20 MG/ML IJ SOLN
10.0000 mg | Freq: Four times a day (QID) | INTRAMUSCULAR | Status: DC | PRN
  Administered 2021-10-16: 10 mg via INTRAVENOUS
  Filled 2021-10-15: qty 1

## 2021-10-15 MED ORDER — SENNOSIDES-DOCUSATE SODIUM 8.6-50 MG PO TABS: 1.0000 | ORAL_TABLET | Freq: Every evening | ORAL | Status: DC | PRN

## 2021-10-15 MED ORDER — ACETAMINOPHEN 650 MG RE SUPP: 650.0000 mg | Freq: Four times a day (QID) | RECTAL | Status: DC | PRN

## 2021-10-15 MED ORDER — LORAZEPAM 2 MG/ML IJ SOLN
1.0000 mg | INTRAMUSCULAR | Status: DC | PRN
Start: 2021-10-15 — End: 2021-10-17
  Administered 2021-10-15: 1 mg via INTRAVENOUS
  Filled 2021-10-15: qty 1
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
## 2021-10-15 NOTE — ED Triage Notes (Signed)
First Nurse: Pt here from home via [facility] with weakness. Pt states she does not feel like herself. Pt has hx of htn, stopped taking medication. Pt has irritation to her left eye.    180/90, 182/88 97.7 110-cbg 99% RA 53

## 2021-10-15 NOTE — Assessment & Plan Note (Signed)
-   Check UA, procalcitonin, B12, TSH

## 2021-10-15 NOTE — Assessment & Plan Note (Signed)
-   Hydralazine 10 mg IV every 6 hours as needed for SBP greater than 180, 4 doses ordered

## 2021-10-15 NOTE — Assessment & Plan Note (Addendum)
-   Etiology work-up in progress at this time query new diagnosis of heart failure in setting of dyspnea with exertion and unexpected weight gain - Complete echo has been ordered, check BNP - Check TSH, B12 - Cardiology, Dr. Juliann Pares has been consulted via secure chat and he has acknowledged the consultation  - Strict I's and O's - Progressive cardiac, observation

## 2021-10-15 NOTE — Assessment & Plan Note (Signed)
-   I suspect this is secondary to new heart failure - However given acute tenderness at the calf, I have ordered ultrasound of the bilateral lower extremity to assess for DVT

## 2021-10-15 NOTE — Assessment & Plan Note (Signed)
-   Melatonin 5 mg nightly as needed for sleep ordered - Patient has been ordered as needed Ativan for MRI therefore I am hesitant adding more sedating medications to help her sleep at night - Extensive discussion with patient at bedside that she needs to discuss her insomnia with her PCP so that they can order an outpatient sleep study and possible referral to sleep specialist - I also discussed that as an inpatient provider, I cannot order a sleep study for her outpatient - She endorses understanding and compliance

## 2021-10-15 NOTE — ED Provider Notes (Signed)
[facility] Provider Note    Event Date/Time   First MD Initiated Contact with Patient 10/15/21 1404     (approximate)   History   Weakness   HPI  Sierra Dickerson is a 76 y.o. female patient reports being weak for several months but much worse in the last week or 2.  She attributes this to being put on Seroquel around January.  She has stopped that but she still continues to be weak.  Again the weakness has been worse in the last week or so.  She is not really having any shortness of breath or chest tightness or anything else.  She is not having any fever or cough.  Pulse rate was 49 at 1 point today its been low in the low 50s very frequently 50 or 51 in the emergency department.  Patient also reportedly took self off of her blood pressure medication.      Physical Exam   Triage Vital Signs: ED Triage Vitals  Enc Vitals Group     BP 10/15/21 1228 (!) 162/84     Pulse Rate 10/15/21 1228 (!) 49     Resp 10/15/21 1228 16     Temp 10/15/21 1228 97.6 F (36.4 C)     Temp Source 10/15/21 1228 Oral     SpO2 10/15/21 1228 97 %     Weight --      Height --      Head Circumference --      Peak Flow --      Pain Score 10/15/21 1223 4     Pain Loc --      Pain Edu? --      Excl. in GC? --     Most recent vital signs: Vitals:   10/15/21 1530 10/15/21 1545  BP: (!) 205/155   Pulse: (!) 54 69  Resp: (!) 25 (!) 23  Temp:    SpO2: 100% 100%     General: Awake, no distress.  CV:  Good peripheral perfusion.  Heart regular rate and rhythm seems slightly slow Resp:  Normal effort.  Lungs are clear Abd:  No distention.  Soft and nontender Extremities 1+ edema bilaterally patient reports gets worse when she is up.   ED Results / Procedures / Treatments   Labs (all labs ordered are listed, but only abnormal results are displayed) Labs Reviewed  BASIC METABOLIC PANEL - Abnormal; Notable for the following components:      Result Value   Glucose, Bld  108 (*)    Creatinine, Ser 1.01 (*)    GFR, Estimated 58 (*)    All other components within normal limits  CBC  HEPATIC FUNCTION PANEL  URINALYSIS, ROUTINE W REFLEX MICROSCOPIC  CBG MONITORING, ED  TROPONIN I (HIGH SENSITIVITY)     EKG  EKG read and interpreted by me shows sinus bradycardia rate of 47 normal axis left bundle branch block no obvious acute new EKG changes except for rate   RADIOLOGY Chest x-ray read by radiology reviewed and interpreted by me does not show any acute changes   PROCEDURES:  Critical Care performed:   Procedures   MEDICATIONS ORDERED IN ED: Medications  cloNIDine (CATAPRES) tablet 0.1 mg (has no administration in time range)  acetaminophen (TYLENOL) tablet 650 mg (has no administration in time range)    Or  acetaminophen (TYLENOL) suppository 650 mg (has no administration in time range)  ondansetron (ZOFRAN) tablet 4 mg (has no administration in time range)  Or  ondansetron (ZOFRAN) injection 4 mg (has no administration in time range)  enoxaparin (LOVENOX) injection 40 mg (has no administration in time range)  senna-docusate (Senokot-S) tablet 1 tablet (has no administration in time range)     IMPRESSION / MDM / ASSESSMENT AND PLAN / ED COURSE  I reviewed the triage vital signs and the nursing notes.  Differential diagnosis includes, but is not limited to, weakness due to bradycardia.  Initial troponin is negative so it is unlikely she has an MI.  Chest x-ray is clear she is not hypoxic so do not think she has CHF.  PE would make her tachycardic if anything.  She is possibly slightly dehydrated or having a little bit of AKI because her GFR is 58 normal is greater than 60.  Patient's presentation is most consistent with acute complicated illness / injury requiring diagnostic workup.  The patient is on the cardiac monitor to evaluate for evidence of arrhythmia and/or significant heart rate changes.  Only sinus bradycardia is seen.       FINAL CLINICAL IMPRESSION(S) / ED DIAGNOSES   Final diagnoses:  Weakness  Bradycardia  Hypertension, unspecified type     Rx / DC Orders   ED Discharge Orders     None        Note:  This document was prepared using Dragon voice recognition software and may include unintentional dictation errors.   Arnaldo Natal, MD 10/15/21 1630

## 2021-10-15 NOTE — Assessment & Plan Note (Addendum)
-   Per patient report this is intermittent - MRI of the brain with and without contrast ordered (Discussed and confirmed with radiology)

## 2021-10-15 NOTE — ED Notes (Signed)
Pt refuses to use bathroom in room. Pt also refuses to have assistance to bathroom in hallway.

## 2021-10-15 NOTE — Hospital Course (Addendum)
[name] is a 76 year old female with medical history of PTSD, borderline personality disorder, GERD, migraine headaches, who presents emergency department for chief concerns of weakness.  Initial vitals in the emergency department showed temperature of 97.6, respiration rate of 16, heart rate of 49, blood pressure 160/84, increased to 205/155, latest blood pressure at the time of this dictation is 184/88, SPO2 of 97% on room air.  Serum sodium 140, potassium 4.3, chloride 108, bicarb 20, BUN of 16, serum creatinine 1.01, GFR 58, nonfasting blood glucose 108, WBC 5.7, hemoglobin 12.7, platelets of 253.  High sensitive troponin was 6.  ED treatment: Clonidine 0.1 mg p.o. one-time dose

## 2021-10-15 NOTE — Assessment & Plan Note (Signed)
-   Serum sodium has been 0.9 9-1.07/GFR 54-60 - Appears patient is at baseline at this time

## 2021-10-16 ENCOUNTER — Observation Stay
Admit: 2021-10-16 | Discharge: 2021-10-16 | Disposition: A | Discharge status: Home / Self Care | Payer: Medicare PPO | Attending: Internal Medicine | Admitting: Internal Medicine

## 2021-10-16 ENCOUNTER — Observation Stay: Admit: 2021-10-16 | Payer: Medicare PPO

## 2021-10-16 ENCOUNTER — Encounter: Payer: Self-pay | Admitting: Internal Medicine

## 2021-10-16 DIAGNOSIS — H532 Diplopia: Secondary | ICD-10-CM

## 2021-10-16 DIAGNOSIS — M7989 Other specified soft tissue disorders: Secondary | ICD-10-CM

## 2021-10-16 DIAGNOSIS — R531 Weakness: Secondary | ICD-10-CM | POA: Diagnosis not present

## 2021-10-16 DIAGNOSIS — N1831 Chronic kidney disease, stage 3a: Secondary | ICD-10-CM

## 2021-10-16 DIAGNOSIS — R001 Bradycardia, unspecified: Secondary | ICD-10-CM | POA: Diagnosis not present

## 2021-10-16 DIAGNOSIS — I160 Hypertensive urgency: Secondary | ICD-10-CM

## 2021-10-16 LAB — BASIC METABOLIC PANEL
Anion gap: 4 — ABNORMAL LOW (ref 5–15)
BUN: 19 mg/dL (ref 8–23)
CO2: 26 mmol/L (ref 22–32)
Calcium: 8.5 mg/dL — ABNORMAL LOW (ref 8.9–10.3)
Chloride: 111 mmol/L (ref 98–111)
Creatinine, Ser: 1.04 mg/dL — ABNORMAL HIGH (ref 0.44–1.00)
GFR, Estimated: 56 mL/min — ABNORMAL LOW (ref >60)
Glucose, Bld: 85 mg/dL (ref 70–99)
Potassium: 3.8 mmol/L (ref 3.5–5.1)
Sodium: 141 mmol/L (ref 135–145)

## 2021-10-16 LAB — CBC
HCT: 37.8 % (ref 36.0–46.0)
Hemoglobin: 12.2 g/dL (ref 12.0–15.0)
MCH: 29.7 pg (ref 26.0–34.0)
MCHC: 32.3 g/dL (ref 30.0–36.0)
MCV: 92 fL (ref 80.0–100.0)
Platelets: 248 10*3/uL (ref 150–400)
RBC: 4.11 MIL/uL (ref 3.87–5.11)
RDW: 13.2 % (ref 11.5–15.5)
WBC: 8 10*3/uL (ref 4.0–10.5)
nRBC: 0 % (ref 0.0–0.2)

## 2021-10-16 LAB — VITAMIN B12: Vitamin B-12: 416 pg/mL (ref 180–914)

## 2021-10-16 MED ORDER — SALINE SPRAY 0.65 % NA SOLN
1.0000 | NASAL | Status: DC | PRN
  Administered 2021-10-16: 1 via NASAL
  Filled 2021-10-16: qty 44

## 2021-10-16 MED ORDER — LISINOPRIL 10 MG PO TABS
10.0000 mg | ORAL_TABLET | Freq: Every day | ORAL | Status: DC
  Administered 2021-10-16 – 2021-10-17 (×2): 10 mg via ORAL
  Filled 2021-10-16 (×2): qty 1

## 2021-10-16 MED ORDER — PERFLUTREN LIPID MICROSPHERE
1.0000 mL | INTRAVENOUS | Status: AC | PRN
  Administered 2021-10-16: 2 mL via INTRAVENOUS

## 2021-10-16 MED ORDER — AMLODIPINE BESYLATE 5 MG PO TABS
5.0000 mg | ORAL_TABLET | Freq: Every day | ORAL | Status: DC
  Administered 2021-10-16 – 2021-10-17 (×2): 5 mg via ORAL
  Filled 2021-10-16 (×2): qty 1
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
## 2021-10-16 NOTE — Evaluation (Signed)
Physical Therapy Evaluation Patient Details Name: Cesar Alf MRN: [id number] DOB: 03-Oct-1945 Today's Date: 10/16/2021  History of Present Illness  76 year old female with medical history of PTSD, borderline personality disorder, GERD, migraine headaches, who presents emergency department for chief concerns of weakness.  Pt c/o intermittent b/l LE numbness, only rare occasional dizziness.  Clinical Impression  Pt was able to ambulate confidently and safely in the hallway and negotiated up/down steps w/o assist.  She reports her steps at home are not to code and she struggles with them - educated on b/l rails and sideways strategy which she seemed to feel would certainly benefit her at home.  She had some mild balance issues with testing and though she did not buckle or lose balance she showed enough deficits and reduced safety awareness that further PT her and at home would be beneficial.     Recommendations for follow up therapy are one component of a multi-disciplinary discharge planning process, led by the attending physician.  Recommendations may be updated based on patient status, additional functional criteria and insurance authorization.  Follow Up Recommendations Home health PT      Assistance Recommended at Discharge PRN  Patient can return home with the following       Equipment Recommendations None recommended by PT  Recommendations for Other Services       Functional Status Assessment Patient has had a recent decline in their functional status and demonstrates the ability to make significant improvements in function in a reasonable and predictable amount of time.     Precautions / Restrictions Precautions Precautions: Fall (moderate) Restrictions Weight Bearing Restrictions: No      Mobility  Bed Mobility Overal bed mobility: Independent                  Transfers Overall transfer level: Independent Equipment used: None                     Ambulation/Gait Ambulation/Gait assistance: Supervision Gait Distance (Feet): 150 Feet Assistive device: None, Straight cane         General Gait Details: Pt able to hold conversation and ambulate casually in the hallway, holding SPC most of the time rather than actually using it.  HR elevated t/o the effort from 100s to ~120 with no overt DOE or fatigue.  Stairs Stairs: Yes Stairs assistance: Supervision Stair Management: One rail Left Number of Stairs: 15 General stair comments: Pt able to negotiate up/down steps w/o assist - cues to use L rail in both hands for her "not to code" steps to the second floor  Wheelchair Mobility    Modified Rankin (Stroke Patients Only)       Balance Overall balance assessment: Mild deficits observed, not formally tested (able to maintain feet together, eyes closed with mild perturbations.  Needs HHA for heel raises and SLS, no buckling of excessive unsteadiness)                                           Pertinent Vitals/Pain Pain Assessment Pain Assessment: No/denies pain    Home Living Family/patient expects to be discharged to:: Private residence Living Arrangements: Alone Available Help at Discharge: Neighbor;Available PRN/intermittently Type of Home: House Home Access: Stairs to enter Entrance Stairs-Rails: Can reach both Entrance Stairs-Number of Steps: 4 Alternate Level Stairs-Number of Steps: 15 Home Layout: Two level Home Equipment: Cane -  single point      Prior Function Prior Level of Function : Independent/Modified Independent             Mobility Comments: 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, runs errands, works in the yard, Metallurgist        Extremity/Trunk Assessment   Upper Extremity Assessment Upper Extremity Assessment: Overall WFL for tasks assessed;Generalized weakness    Lower Extremity Assessment Lower Extremity Assessment: Overall WFL for tasks assessed;Generalized weakness        Communication   Communication: No difficulties  Cognition Arousal/Alertness: Awake/alert Behavior During Therapy: Restless Overall Cognitive Status: Difficult to assess                                 General Comments: Pt able to hold conversation, alert and aware of situation        General Comments      Exercises     Assessment/Plan    PT Assessment Patient needs continued PT services  PT Problem List Decreased strength;Decreased balance;Decreased safety awareness;Cardiopulmonary status limiting activity       PT Treatment Interventions DME instruction;Gait training;Stair training;Functional mobility training;Therapeutic activities;Therapeutic exercise;Balance training;Patient/family education;Neuromuscular re-education    PT Goals (Current goals can be found in the Care Plan section)  Acute Rehab PT Goals Patient Stated Goal: go home PT Goal Formulation: With patient Time For Goal Achievement: 10/29/21 Potential to Achieve Goals: Good    Frequency Min 2X/week     Co-evaluation               AM-PAC PT "6 Clicks" Mobility  Outcome Measure Help needed turning from your back to your side while in a flat bed without using bedrails?: None Help needed moving from lying on your back to sitting on the side of a flat bed without using bedrails?: None Help needed moving to and from a bed to a chair (including a wheelchair)?: None Help needed standing up from a chair using your arms (e.g., wheelchair or bedside chair)?: None Help needed to walk in hospital room?: None Help needed climbing 3-5 steps with a railing? : A Little 6 Click Score: 23    End of Session Equipment Utilized During Treatment: Gait belt Activity Tolerance: Patient tolerated treatment well Patient left: in chair;with call bell/phone within reach Nurse Communication: Mobility status PT Visit Diagnosis: Unsteadiness on feet (R26.81);Muscle weakness (generalized) (M62.81)     Time: 2025-4270 PT Time Calculation (min) (ACUTE ONLY): 40 min   Charges:   PT Evaluation $PT Eval Low Complexity: 1 Low PT Treatments $Therapeutic Activity: 8-22 mins        Malachi Pro, DPT 10/16/2021, 5:58 PM

## 2021-10-16 NOTE — Progress Notes (Signed)
   10/16/21 2121  Provider Notification  Provider Name/Title B Jon Billings  Date Provider Notified 10/16/21  Time Provider Notified 2125  Method of Notification Page (message)  Notification Reason Red med refusal (Pt refused Lovenox)  Provider response Other (Comment) (no new orders)  Date of Provider Response 10/16/21  Time of Provider Response 2126     Pt refused scheduled Lovenox. Education provided regarding indication of medication. All questions answered.

## 2021-10-16 NOTE — Progress Notes (Signed)
Admission profile updated. ?

## 2021-10-16 NOTE — Progress Notes (Addendum)
Progress Note    Sierra Dickerson  HYW:[id number] DOB: 1945/11/10  DOA: 10/15/2021 PCP: Zandra Abts, MD      Brief Narrative:    Medical records reviewed and are as summarized below:   [name] is a 76 year old female with medical history of PTSD, borderline personality disorder, GERD, migraine headaches, who presents emergency department for chief concerns of weakness.  Initial vitals in the emergency department showed temperature of 97.6, respiration rate of 16, heart rate of 49, blood pressure 160/84, increased to 205/155, latest blood pressure at the time of this dictation is 184/88, SPO2 of 97% on room air.  Serum sodium 140, potassium 4.3, chloride 108, bicarb 20, BUN of 16, serum creatinine 1.01, GFR 58, nonfasting blood glucose 108, WBC 5.7, hemoglobin 12.7, platelets of 253.  High sensitive troponin was 6.  ED treatment: Clonidine 0.1 mg p.o. one-time dose      Assessment/Plan:   Principal Problem:   Symptomatic bradycardia Active Problems:   PTSD (post-traumatic stress disorder)   Weakness   Hypertensive urgency   Stage 3a chronic kidney disease (CKD) (HCC)   Insomnia   Leg swelling   Diplopia   Sinus bradycardia: Of note, patient was on propranolol prior to admission.  She has been advised to avoid propranolol..  2D echo is pending.  Follow-up with cardiologist.  Hypertensive urgency: BP went up to 208/91 today.  She said she used to be on amlodipine but she stopped taking it some months ago.  She restarted on amlodipine and lisinopril.  Side effects of these medications were discussed.  She has agreed to try them.  Dizziness, unsteady gait: PT evaluation.  Diplopia: This could be due to severe hypertension.  CKD stage IIIa: Creatinine is stable.  Patient was surprised about this diagnosis.  She was informed that blood work in January 2023.  also revealed CKD stage IIIa.  Mild bilateral leg swelling and tenderness: no evidence of DVT  on venous duplex.  Other comorbidities include borderline personality disorder, PTSD  Plan of care was discussed with the patient in detail.  She was informed that we will have to assess her response to new blood pressure medicines and wait for 2D echo report prior to discharge.  She was also informed that she needs to be evaluated by PT prior to discharge.   Diet Order             Diet Heart Room service appropriate? Yes; Fluid consistency: Thin  Diet effective now                          Consultants: Cardiologist  Procedures: None    Medications:    amLODipine  5 mg Oral Daily   enoxaparin (LOVENOX) injection  40 mg Subcutaneous QHS   lisinopril  10 mg Oral Daily   multivitamin with minerals  1 tablet Oral Daily   Continuous Infusions:   Anti-infectives (From admission, onward)    None              Family Communication/Anticipated D/C date and plan/Code Status   DVT prophylaxis: enoxaparin (LOVENOX) injection 40 mg Start: 10/15/21 2200 Place TED hose Start: 10/15/21 1613     Code Status: Full Code  Family Communication: None Disposition Plan: Plan to discharge home tomorrow   Status is: Observation The patient will require care spanning > 2 midnights and should be moved to inpatient because: Dizziness, unsteady gait, uncontrolled hypertension  Subjective:   Interval events noted.  No chest pain, palpitation or shortness of breath.  Patient became dizzy and unsteady on her feet when she got up to ambulate with her nurse.  Objective:    Vitals:   10/16/21 1200 10/16/21 1212 10/16/21 1300 10/16/21 1525  BP: (!) 208/91 (!) 203/70 (!) 150/58 (!) 161/93  Pulse: 68 62 77 80  Resp:  16 14 20   Temp:    98.5 F (36.9 C)  TempSrc:      SpO2: 95% 98% 98% 100%   No data found.  No intake or output data in the 24 hours ending 10/16/21 1727 There were no vitals filed for this visit.  Exam:  GEN: NAD SKIN: No rash EYES:  EOMI ENT: MMM CV: RRR PULM: CTA B ABD: soft, ND, NT, +BS CNS: AAO x 3, non focal EXT: Trace bilateral leg edema with minimal tenderness         Data Reviewed:   I have personally reviewed following labs and imaging studies:  Labs: Labs show the following:   Basic Metabolic Panel: Recent Labs  Lab 10/15/21 1232 10/16/21 0414  NA 140 141  K 4.3 3.8  CL 108 111  CO2 26 26  GLUCOSE 108* 85  BUN 16 19  CREATININE 1.01* 1.04*  CALCIUM 9.0 8.5*   GFR CrCl cannot be calculated (Unknown ideal weight.). Liver Function Tests: Recent Labs  Lab 10/15/21 1424  AST 25  ALT 17  ALKPHOS 73  BILITOT 0.5  PROT 6.9  ALBUMIN 3.7   No results for input(s): "LIPASE", "AMYLASE" in the last 168 hours. No results for input(s): "AMMONIA" in the last 168 hours. Coagulation profile No results for input(s): "INR", "PROTIME" in the last 168 hours.  CBC: Recent Labs  Lab 10/15/21 1232 10/16/21 0414  WBC 5.7 8.0  HGB 12.7 12.2  HCT 39.1 37.8  MCV 92.9 92.0  PLT 253 248   Cardiac Enzymes: No results for input(s): "CKTOTAL", "CKMB", "CKMBINDEX", "TROPONINI" in the last 168 hours. BNP (last 3 results) No results for input(s): "PROBNP" in the last 8760 hours. CBG: No results for input(s): "GLUCAP" in the last 168 hours. D-Dimer: No results for input(s): "DDIMER" in the last 72 hours. Hgb A1c: No results for input(s): "HGBA1C" in the last 72 hours. Lipid Profile: No results for input(s): "CHOL", "HDL", "LDLCALC", "TRIG", "CHOLHDL", "LDLDIRECT" in the last 72 hours. Thyroid function studies: Recent Labs    10/15/21 1730  TSH 3.215   Anemia work up: Recent Labs    10/15/21 2211  VITAMINB12 416   Sepsis Labs: Recent Labs  Lab 10/15/21 1232 10/15/21 1730 10/16/21 0414  PROCALCITON  --  <0.10  --   WBC 5.7  --  8.0    Microbiology Recent Results (from the past 240 hour(s))  SARS Coronavirus 2 by RT PCR (hospital order, performed in [facility] hospital lab)  *cepheid single result test* Anterior Nasal Swab     Status: None   Collection Time: 10/15/21  6:00 PM   Specimen: Anterior Nasal Swab  Result Value Ref Range Status   SARS Coronavirus 2 by RT PCR NEGATIVE NEGATIVE Final    Comment: (NOTE) SARS-CoV-2 target nucleic acids are NOT DETECTED.  The SARS-CoV-2 RNA is generally detectable in upper and lower respiratory specimens during the acute phase of infection. The lowest concentration of SARS-CoV-2 viral copies this assay can detect is 250 copies / mL. A negative result does not preclude SARS-CoV-2 infection and should not be  used as the sole basis for treatment or other patient management decisions.  A negative result may occur with improper specimen collection / handling, submission of specimen other than nasopharyngeal swab, presence of viral mutation(s) within the areas targeted by this assay, and inadequate number of viral copies (<250 copies / mL). A negative result must be combined with clinical observations, patient history, and epidemiological information.  Fact Sheet for Patients:   RoadLapTop.co.za  Fact Sheet for Healthcare Providers: http://kim-miller.com/  This test is not yet approved or  cleared by the [place] FDA and has been authorized for detection and/or diagnosis of SARS-CoV-2 by FDA under an Emergency Use Authorization (EUA).  This EUA will remain in effect (meaning this test can be used) for the duration of the COVID-19 declaration under Section 564(b)(1) of the Act, 21 U.S.C. section 360bbb-3(b)(1), unless the authorization is terminated or revoked sooner.  Performed at [facility], [address] [place], [place] [address]     Procedures and diagnostic studies:  MR BRAIN W WO CONTRAST  Result Date: 10/16/2021 CLINICAL DATA:  Initial evaluation for neuro deficit, stroke suspected, mental status change, diplopia. EXAM: MRI HEAD WITHOUT AND  WITH CONTRAST TECHNIQUE: Multiplanar, multiecho pulse sequences of the brain and surrounding structures were obtained without and with intravenous contrast. CONTRAST:  52mL GADAVIST GADOBUTROL 1 MMOL/ML IV SOLN COMPARISON:  Prior CT from 03/27/2017. FINDINGS: Brain: Cerebral volume within normal limits. Patchy T2/FLAIR hyperintensity involving the periventricular, deep, and subcortical white matter both cerebral hemispheres, nonspecific, but most likely related chronic microvascular ischemic disease, moderate in nature. Patchy involvement of the pons noted. No abnormal foci of restricted diffusion to suggest acute or subacute ischemia. Gray-white matter differentiation maintained. No encephalomalacia to suggest chronic cortical infarction. No acute or chronic intracranial blood products. No mass lesion, midline shift or mass effect no hydrocephalus or extra-axial fluid collection. Pituitary gland and suprasellar region within normal limits. No abnormal enhancement. Vascular: Right vertebral artery hypoplastic and not well seen. Major intracranial vascular flow voids are otherwise maintained. Skull and upper cervical spine: Craniocervical junction within normal limits. Bone marrow signal intensity normal. No scalp soft tissue abnormality. Sinuses/Orbits: Globes and orbital soft tissues within normal limits. Paranasal sinuses are largely clear. No mastoid effusion. Other: None. IMPRESSION: 1. No acute intracranial abnormality or findings to explain patient's symptoms. 2. Patchy T2/FLAIR hyperintensity involving the supratentorial cerebral white matter and pons, nonspecific, but most likely related to chronic small vessel ischemic disease. Overall, appearance is moderate in nature. Electronically Signed   By: Rise Mu M.D.   On: 10/16/2021 00:35   US Venous Img Lower Bilateral (DVT)  Result Date: 10/15/2021 CLINICAL DATA:  Bilateral lower extremity swelling EXAM: Bilateral LOWER EXTREMITY VENOUS DOPPLER  ULTRASOUND TECHNIQUE: Gray-scale sonography with compression, as well as color and duplex ultrasound, were performed to evaluate the deep venous system(s) from the level of the common femoral vein through the popliteal and proximal calf veins. COMPARISON:  None Available. FINDINGS: VENOUS Normal compressibility of the common femoral, superficial femoral, and popliteal veins, as well as the visualized calf veins. Visualized portions of profunda femoral vein and great saphenous vein unremarkable. No filling defects to suggest DVT on grayscale or color Doppler imaging. Doppler waveforms show normal direction of venous flow, normal respiratory plasticity and response to augmentation. OTHER None. Limitations: none IMPRESSION: Negative. Electronically Signed   By: Jasmine Pang M.D.   On: 10/15/2021 22:43   DG Chest Portable 1 View  Result Date: 10/15/2021 CLINICAL DATA:  Weakness. EXAM: PORTABLE CHEST 1 VIEW COMPARISON:  December 08, 2017. FINDINGS: The heart size and mediastinal contours are within normal limits. Both lungs are clear. Old right rib fractures are noted. IMPRESSION: No active disease. Electronically Signed   By: Marijo Conception M.D.   On: 10/15/2021 14:48               LOS: 0 days   Cannon Arreola  [facility]   Pager on [email or web address]. If 7PM-7AM, please contact night-coverage at [email or web address]     10/16/2021, 5:27 PM

## 2021-10-16 NOTE — Progress Notes (Signed)
[facility] Cardiology    SUBJECTIVE: Patient states to be doing reasonably well elevated blood pressure is some sinus bradycardia no blackout spells or syncope no high-grade blocks patient's had some generalized weakness history of PTSD here for routine follow-up.   Vitals:   10/16/21 1200 10/16/21 1212 10/16/21 1300 10/16/21 1525  BP: (!) 208/91 (!) 203/70 (!) 150/58 (!) 161/93  Pulse: 68 62 77 80  Resp:  16 14 20   Temp:    98.5 F (36.9 C)  TempSrc:      SpO2: 95% 98% 98% 100%    No intake or output data in the 24 hours ending 10/16/21 1638    PHYSICAL EXAM  General: Well developed, well nourished, in no acute distress HEENT:  Normocephalic and atramatic Neck:  No JVD.  Lungs: Clear bilaterally to auscultation and percussion. Heart: HRRR . Normal S1 and S2 without gallops or murmurs.  Abdomen: Bowel sounds are positive, abdomen soft and non-tender  Msk:  Back normal, normal gait. Normal strength and tone for age. Extremities: No clubbing, cyanosis or edema.   Neuro: Alert and oriented X 3. Psych:  Good affect, responds appropriately   LABS: Basic Metabolic Panel: Recent Labs    10/15/21 1232 10/16/21 0414  NA 140 141  K 4.3 3.8  CL 108 111  CO2 26 26  GLUCOSE 108* 85  BUN 16 19  CREATININE 1.01* 1.04*  CALCIUM 9.0 8.5*   Liver Function Tests: Recent Labs    10/15/21 1424  AST 25  ALT 17  ALKPHOS 73  BILITOT 0.5  PROT 6.9  ALBUMIN 3.7   No results for input(s): "LIPASE", "AMYLASE" in the last 72 hours. CBC: Recent Labs    10/15/21 1232 10/16/21 0414  WBC 5.7 8.0  HGB 12.7 12.2  HCT 39.1 37.8  MCV 92.9 92.0  PLT 253 248   Cardiac Enzymes: No results for input(s): "CKTOTAL", "CKMB", "CKMBINDEX", "TROPONINI" in the last 72 hours. BNP: Invalid input(s): "POCBNP" D-Dimer: No results for input(s): "DDIMER" in the last 72 hours. Hemoglobin A1C: No results for input(s): "HGBA1C" in the last 72 hours. Fasting Lipid Panel: No results for input(s):  "CHOL", "HDL", "LDLCALC", "TRIG", "CHOLHDL", "LDLDIRECT" in the last 72 hours. Thyroid Function Tests: Recent Labs    10/15/21 1730  TSH 3.215   Anemia Panel: Recent Labs    10/15/21 2211  VITAMINB12 416    MR BRAIN W WO CONTRAST  Result Date: 10/16/2021 CLINICAL DATA:  Initial evaluation for neuro deficit, stroke suspected, mental status change, diplopia. EXAM: MRI HEAD WITHOUT AND WITH CONTRAST TECHNIQUE: Multiplanar, multiecho pulse sequences of the brain and surrounding structures were obtained without and with intravenous contrast. CONTRAST:  31mL GADAVIST GADOBUTROL 1 MMOL/ML IV SOLN COMPARISON:  Prior CT from 03/27/2017. FINDINGS: Brain: Cerebral volume within normal limits. Patchy T2/FLAIR hyperintensity involving the periventricular, deep, and subcortical white matter both cerebral hemispheres, nonspecific, but most likely related chronic microvascular ischemic disease, moderate in nature. Patchy involvement of the pons noted. No abnormal foci of restricted diffusion to suggest acute or subacute ischemia. Gray-white matter differentiation maintained. No encephalomalacia to suggest chronic cortical infarction. No acute or chronic intracranial blood products. No mass lesion, midline shift or mass effect no hydrocephalus or extra-axial fluid collection. Pituitary gland and suprasellar region within normal limits. No abnormal enhancement. Vascular: Right vertebral artery hypoplastic and not well seen. Major intracranial vascular flow voids are otherwise maintained. Skull and upper cervical spine: Craniocervical junction within normal limits. Bone marrow signal intensity normal. No  scalp soft tissue abnormality. Sinuses/Orbits: Globes and orbital soft tissues within normal limits. Paranasal sinuses are largely clear. No mastoid effusion. Other: None. IMPRESSION: 1. No acute intracranial abnormality or findings to explain patient's symptoms. 2. Patchy T2/FLAIR hyperintensity involving the  supratentorial cerebral white matter and pons, nonspecific, but most likely related to chronic small vessel ischemic disease. Overall, appearance is moderate in nature. Electronically Signed   By: Rise Mu M.D.   On: 10/16/2021 00:35   US Venous Img Lower Bilateral (DVT)  Result Date: 10/15/2021 CLINICAL DATA:  Bilateral lower extremity swelling EXAM: Bilateral LOWER EXTREMITY VENOUS DOPPLER ULTRASOUND TECHNIQUE: Gray-scale sonography with compression, as well as color and duplex ultrasound, were performed to evaluate the deep venous system(s) from the level of the common femoral vein through the popliteal and proximal calf veins. COMPARISON:  None Available. FINDINGS: VENOUS Normal compressibility of the common femoral, superficial femoral, and popliteal veins, as well as the visualized calf veins. Visualized portions of profunda femoral vein and great saphenous vein unremarkable. No filling defects to suggest DVT on grayscale or color Doppler imaging. Doppler waveforms show normal direction of venous flow, normal respiratory plasticity and response to augmentation. OTHER None. Limitations: none IMPRESSION: Negative. Electronically Signed   By: Jasmine Pang M.D.   On: 10/15/2021 22:43   DG Chest Portable 1 View  Result Date: 10/15/2021 CLINICAL DATA:  Weakness. EXAM: PORTABLE CHEST 1 VIEW COMPARISON:  December 08, 2017. FINDINGS: The heart size and mediastinal contours are within normal limits. Both lungs are clear. Old right rib fractures are noted. IMPRESSION: No active disease. Electronically Signed   By: Lupita Raider M.D.   On: 10/15/2021 14:48     Echo pending  TELEMETRY: Sinus bradycardia rate of 60:  ASSESSMENT AND PLAN:  Principal Problem:   Symptomatic bradycardia Active Problems:   PTSD (post-traumatic stress disorder)   Weakness   Essential hypertension   Stage 3a chronic kidney disease (CKD) (HCC)   Insomnia   Leg swelling   Diplopia    Plan Pretension  management control amlodipine hydralazine ACE or ARB HCTZ patient has significant bradycardia so we will avoid Cardizem and beta-blockers Hold off on permanent pacemaker for now continue conservative management Continue reflux therapy with Protonix as necessary Generalized weakness unclear recommend routine aerobic exercise Recommend functional study probably to be done as an outpatient Recommend echocardiogram follow-up EKGs and troponin Chronic renal insufficiency consider adequate hydration of the patient follow-up with nephrology   Alwyn Pea, MD 10/16/2021 4:38 PM

## 2021-10-16 NOTE — ED Notes (Signed)
   10/16/21 1212  Vitals  BP (!) 203/70  MAP (mmHg) 105  Pulse Rate 62  ECG Heart Rate 64  Resp 16  MEWS COLOR  MEWS Score Color Yellow  Oxygen Therapy  SpO2 98 %  O2 Device Room Air  MEWS Score  MEWS Temp 0  MEWS Systolic 2  MEWS Pulse 0  MEWS RR 0  MEWS LOC 0  MEWS Score 2   PRN IV hydralazine given per MAR. MD made aware.

## 2021-10-16 NOTE — Care Management Obs Status (Signed)
MEDICARE OBSERVATION STATUS NOTIFICATION   Patient Details  Name: Sierra Dickerson MRN: [id number] Date of Birth: 10-Jun-1945   Medicare Observation Status Notification Given:  Yes    Margarito Liner, LCSW 10/16/2021, 3:53 PM

## 2021-10-16 NOTE — ED Notes (Signed)
   10/16/21 1300  Vitals  BP (!) 150/58  MAP (mmHg) 86  Pulse Rate 77  ECG Heart Rate 79  Resp 14  MEWS COLOR  MEWS Score Color Green  Oxygen Therapy  SpO2 98 %  O2 Device Room Air  MEWS Score  MEWS Temp 0  MEWS Systolic 0  MEWS Pulse 0  MEWS RR 0  MEWS LOC 0  MEWS Score 0   Recheck after PRN med. Ambulated patient with 1 assist to BR, patient was very dizzy and required more assistance than usual. MD made aware.

## 2021-10-17 DIAGNOSIS — I5032 Chronic diastolic (congestive) heart failure: Secondary | ICD-10-CM | POA: Diagnosis not present

## 2021-10-17 DIAGNOSIS — R531 Weakness: Secondary | ICD-10-CM | POA: Diagnosis not present

## 2021-10-17 DIAGNOSIS — I160 Hypertensive urgency: Secondary | ICD-10-CM | POA: Diagnosis not present

## 2021-10-17 DIAGNOSIS — R001 Bradycardia, unspecified: Secondary | ICD-10-CM | POA: Diagnosis not present

## 2021-10-17 LAB — ECHOCARDIOGRAM COMPLETE
Area-P 1/2: 4.39 cm2
S' Lateral: 2.5 cm

## 2021-10-17 MED ORDER — AMLODIPINE BESYLATE 5 MG PO TABS: 5.0000 mg | ORAL_TABLET | Freq: Every day | ORAL | 0 refills | Status: AC

## 2021-10-17 MED ORDER — LISINOPRIL 10 MG PO TABS: 10.0000 mg | ORAL_TABLET | Freq: Every day | ORAL | 0 refills | Status: AC

## 2021-10-17 MED ORDER — FUROSEMIDE 20 MG PO TABS: 20.0000 mg | ORAL_TABLET | Freq: Every day | ORAL | 0 refills | Status: AC

## 2021-10-17 NOTE — TOC Transition Note (Signed)
Transition of Care [facility]) - CM/SW Discharge Note   Patient Details  Name: Sierra Dickerson MRN: [id number] Date of Birth: 1945-09-02  [facility] CM/SW Contact:  Candie Chroman, LCSW Phone Number: 10/17/2021, 11:46 AM   Clinical Narrative: Patient has orders to discharge home today. CSW met with patient to discuss PT recommendations. She had a pending home health referral to [facility] prior to admission. CSW called and confirmed. Faxed home health orders. No further concerns. CSW signing off.    Final next level of care: Home w Home Health Services Barriers to Discharge: No Barriers Identified   Patient Goals and CMS Choice     Choice offered to / list presented to : Patient  Discharge Placement                Patient to be transferred to facility by: Friend   Patient and family notified of of transfer: 10/17/21  Discharge Plan and Services                          HH Arranged: PT [facility] Agency: [place] Date [place]: 10/17/21   Representative spoke with at [place]: [place] ([place]) Interventions     Readmission Risk Interventions     No data to display

## 2021-10-17 NOTE — Discharge Summary (Signed)
Physician Discharge Summary   Patient: Sierra Dickerson MRN: [id number] DOB: [birth year]  Admit date:     10/15/2021  Discharge date: 10/17/2021  Discharge Physician: Lurene Shadow   PCP: Zandra Abts, MD   Recommendations at discharge:   Follow up with PCP in 1 week to monitor BP and repeat BMP (kidney function and electrolytes)  Discharge Diagnoses: Principal Problem:   Symptomatic bradycardia Active Problems:   PTSD (post-traumatic stress disorder)   Weakness   Hypertensive urgency   Stage 3a chronic kidney disease (CKD) (HCC)   Insomnia   Leg swelling   Diplopia   Chronic diastolic CHF (congestive heart failure) (HCC)  Resolved Problems:   * No resolved hospital problems. [place] Course:  Sierra Dickerson is a 76 year old with medical history significant for CKD stage IIIa, PTSD, baseline personality disorder, GERD, migraine headaches, hypertension, who presented to the hospital because of generalized weakness and dizziness.  She said she had been prescribed amlodipine several months ago but she stopped taking it because it made her feel bad.  She also said she was prescribed Seroquel sometime in January 2023 but she stopped taking it because it made her feel weak.  In the ED, she was found to be bradycardic and hypertensive.  Blood pressure was 205/155 consistent with hypertensive urgency. She was on propranolol prior to admission but this was discontinued because of bradycardia.  She was started on lisinopril and amlodipine for hypertension.  She complained of bilateral leg swelling with mild tenderness and numbness in the feet.  Venous duplex of the lower extremities did not show any evidence of DVT.  2D echo showed grade 1 diastolic dysfunction.  She likely has chronic diastolic CHF.  Lasix was prescribed at discharge.  She was evaluated by PT who recommended home health therapy.  Patient has been able to ambulate in the hallways.  She feels better.  Her blood  pressure has improved and she wants to be discharged home today.  She is deemed stable for discharge.  The importance of medical adherence and close follow-up with PCP for BP monitoring was recommended.        Consultants: Cardiologist Procedures performed: None Disposition: Home health Diet recommendation:  Discharge Diet Orders (From admission, onward)     Start     Ordered   10/17/21 0000  Diet - low sodium heart healthy        10/17/21 1030           Cardiac diet DISCHARGE MEDICATION: Allergies as of 10/17/2021       Reactions   Azithromycin Diarrhea   Severe diarrhea   Benadryl [diphenhydramine]    Any Antihistamine (causes paralysis) per Patient   Sertraline Other (See Comments)   Hallucinations    Estradiol    Gabapentin    Nsaids Nausea And Vomiting   ulcers   Tricyclic Antidepressants         Medication List     STOP taking these medications    diazepam 5 MG tablet Commonly known as: VALIUM   latanoprost 0.005 % ophthalmic solution Commonly known as: XALATAN   propranolol 20 MG tablet Commonly known as: INDERAL       TAKE these medications    amLODipine 5 MG tablet Commonly known as: NORVASC Take 1 tablet (5 mg total) by mouth daily. Start taking on: October 18, 2021   b complex vitamins tablet Take by mouth.   calcium-vitamin D 500-5 MG-MCG tablet Commonly known as: OSCAL WITH  D Take 1 tablet by mouth daily with breakfast.   furosemide 20 MG tablet Commonly known as: Lasix Take 1 tablet (20 mg total) by mouth daily for 5 days.   lisinopril 10 MG tablet Commonly known as: ZESTRIL Take 1 tablet (10 mg total) by mouth daily. Start taking on: October 18, 2021   multivitamin with minerals Tabs tablet Take 1 tablet by mouth daily.   omeprazole 20 MG capsule Commonly known as: PRILOSEC TAKE 1 CAPSULE BY MOUTH EVERY DAY   tiZANidine 4 MG tablet Commonly known as: ZANAFLEX Take 4 mg by mouth 2 (two) times daily.         Follow-up Information     Andrey Campanile, MD. Schedule an appointment as soon as possible for a visit in 1 week(s).   Specialty: Family Medicine Why: The doctor will call you directly to schedule an appointment for blood work Contact information: [place] [address] [phone]                Discharge Exam:  Vital signs: Pulse 98, BP 148/67, respiratory rate 19, oxygen saturation 97% on room air   GEN: NAD SKIN: Warm and dry EYES: No pallor or icterus ENT: MMM CV: RRR PULM: CTA B ABD: soft, ND, NT, +BS CNS: AAO x 3, non focal EXT: Mild bilateral leg edema, no tenderness   Condition at discharge: good  The results of significant diagnostics from this hospitalization (including imaging, microbiology, ancillary and laboratory) are listed below for reference.   Imaging Studies: ECHOCARDIOGRAM COMPLETE  Result Date: 10/17/2021    ECHOCARDIOGRAM REPORT   Patient Name:   Sierra Dickerson Date of Exam: 10/16/2021 Medical Rec #:  [id number]       Height:       62.0 in Accession #:    [id number]      Weight:       135.0 lb Date of Birth:  10/25/1945       BSA:          1.618 m Patient Age:    20 years        BP:           150/59 mmHg Patient Gender: F               HR:           95 bpm. Exam Location:  [facility] Procedure: 2D Echo, Cardiac Doppler, Color Doppler and Intracardiac            Opacification Agent Indications:     R06.00 Dyspnea  History:         Patient has no prior history of Echocardiogram examinations.                  Risk Factors:Hypertension.  Sonographer:     Cresenciano Lick RDCS Referring Phys:  [id number] AMY N COX Diagnosing Phys: Yolonda Kida MD IMPRESSIONS  1. Left ventricular ejection fraction, by estimation, is 55 to 60%. The left ventricle has normal function. The left ventricle has no regional wall motion abnormalities. Left ventricular diastolic parameters are consistent with Grade I diastolic dysfunction (impaired relaxation).  2.  Right ventricular systolic function is normal. The right ventricular size is normal.  3. The mitral valve is normal in structure. Trivial mitral valve regurgitation.  4. The aortic valve is normal in structure. Aortic valve regurgitation is not visualized. FINDINGS  Left Ventricle: Left ventricular ejection fraction, by estimation, is 55 to 60%. The  left ventricle has normal function. The left ventricle has no regional wall motion abnormalities. Definity contrast agent was given IV to delineate the left ventricular  endocardial borders. The left ventricular internal cavity size was normal in size. There is no left ventricular hypertrophy. Left ventricular diastolic parameters are consistent with Grade I diastolic dysfunction (impaired relaxation). Right Ventricle: The right ventricular size is normal. No increase in right ventricular wall thickness. Right ventricular systolic function is normal. Left Atrium: Left atrial size was normal in size. Right Atrium: Right atrial size was normal in size. Pericardium: There is no evidence of pericardial effusion. Mitral Valve: The mitral valve is normal in structure. Trivial mitral valve regurgitation. Tricuspid Valve: The tricuspid valve is normal in structure. Tricuspid valve regurgitation is trivial. Aortic Valve: The aortic valve is normal in structure. Aortic valve regurgitation is not visualized. Pulmonic Valve: The pulmonic valve was normal in structure. Pulmonic valve regurgitation is not visualized. Aorta: The ascending aorta was not well visualized. IAS/Shunts: No atrial level shunt detected by color flow Doppler.  LEFT VENTRICLE PLAX 2D LVIDd:         3.40 cm   Diastology LVIDs:         2.50 cm   LV e' medial:    5.59 cm/s LV PW:         1.00 cm   LV E/e' medial:  10.2 LV IVS:        0.90 cm   LV e' lateral:   5.97 cm/s LVOT diam:     2.00 cm   LV E/e' lateral: 9.5 LV SV:         72 LV SV Index:   45 LVOT Area:     3.14 cm  RIGHT VENTRICLE RV Basal diam:  3.30 cm  RV S prime:     15.00 cm/s TAPSE (M-mode): 2.3 cm LEFT ATRIUM             Index        RIGHT ATRIUM          Index LA diam:        4.10 cm 2.53 cm/m   RA Area:     6.62 cm LA Vol (A2C):   38.6 ml 23.86 ml/m  RA Volume:   11.60 ml 7.17 ml/m LA Vol (A4C):   31.0 ml 19.16 ml/m LA Biplane Vol: 34.7 ml 21.45 ml/m  AORTIC VALVE LVOT Vmax:   115.00 cm/s LVOT Vmean:  84.550 cm/s LVOT VTI:    0.230 m  AORTA Ao Root diam: 2.60 cm Ao Asc diam:  2.80 cm MITRAL VALVE MV Area (PHT): 4.39 cm     SHUNTS MV Decel Time: 173 msec     Systemic VTI:  0.23 m MV E velocity: 56.90 cm/s   Systemic Diam: 2.00 cm MV A velocity: 104.00 cm/s MV E/A ratio:  0.55 Dwayne D Callwood MD Electronically signed by Alwyn Pea MD Signature Date/Time: 10/17/2021/9:06:17 AM    Final    MR BRAIN W WO CONTRAST  Result Date: 10/16/2021 CLINICAL DATA:  Initial evaluation for neuro deficit, stroke suspected, mental status change, diplopia. EXAM: MRI HEAD WITHOUT AND WITH CONTRAST TECHNIQUE: Multiplanar, multiecho pulse sequences of the brain and surrounding structures were obtained without and with intravenous contrast. CONTRAST:  55mL GADAVIST GADOBUTROL 1 MMOL/ML IV SOLN COMPARISON:  Prior CT from 03/27/2017. FINDINGS: Brain: Cerebral volume within normal limits. Patchy T2/FLAIR hyperintensity involving the periventricular, deep, and subcortical white matter both cerebral hemispheres, nonspecific, but  most likely related chronic microvascular ischemic disease, moderate in nature. Patchy involvement of the pons noted. No abnormal foci of restricted diffusion to suggest acute or subacute ischemia. Gray-white matter differentiation maintained. No encephalomalacia to suggest chronic cortical infarction. No acute or chronic intracranial blood products. No mass lesion, midline shift or mass effect no hydrocephalus or extra-axial fluid collection. Pituitary gland and suprasellar region within normal limits. No abnormal enhancement. Vascular: Right  vertebral artery hypoplastic and not well seen. Major intracranial vascular flow voids are otherwise maintained. Skull and upper cervical spine: Craniocervical junction within normal limits. Bone marrow signal intensity normal. No scalp soft tissue abnormality. Sinuses/Orbits: Globes and orbital soft tissues within normal limits. Paranasal sinuses are largely clear. No mastoid effusion. Other: None. IMPRESSION: 1. No acute intracranial abnormality or findings to explain patient's symptoms. 2. Patchy T2/FLAIR hyperintensity involving the supratentorial cerebral white matter and pons, nonspecific, but most likely related to chronic small vessel ischemic disease. Overall, appearance is moderate in nature. Electronically Signed   By: Jeannine Boga M.D.   On: 10/16/2021 00:35   US Venous Img Lower Bilateral (DVT)  Result Date: 10/15/2021 CLINICAL DATA:  Bilateral lower extremity swelling EXAM: Bilateral LOWER EXTREMITY VENOUS DOPPLER ULTRASOUND TECHNIQUE: Gray-scale sonography with compression, as well as color and duplex ultrasound, were performed to evaluate the deep venous system(s) from the level of the common femoral vein through the popliteal and proximal calf veins. COMPARISON:  None Available. FINDINGS: VENOUS Normal compressibility of the common femoral, superficial femoral, and popliteal veins, as well as the visualized calf veins. Visualized portions of profunda femoral vein and great saphenous vein unremarkable. No filling defects to suggest DVT on grayscale or color Doppler imaging. Doppler waveforms show normal direction of venous flow, normal respiratory plasticity and response to augmentation. OTHER None. Limitations: none IMPRESSION: Negative. Electronically Signed   By: Donavan Foil M.D.   On: 10/15/2021 22:43   DG Chest Portable 1 View  Result Date: 10/15/2021 CLINICAL DATA:  Weakness. EXAM: PORTABLE CHEST 1 VIEW COMPARISON:  December 08, 2017. FINDINGS: The heart size and mediastinal  contours are within normal limits. Both lungs are clear. Old right rib fractures are noted. IMPRESSION: No active disease. Electronically Signed   By: Marijo Conception M.D.   On: 10/15/2021 14:48    Microbiology: Results for orders placed or performed during the hospital encounter of 10/15/21  SARS Coronavirus 2 by RT PCR (hospital order, performed in [facility] hospital lab) *cepheid single result test* Anterior Nasal Swab     Status: None   Collection Time: 10/15/21  6:00 PM   Specimen: Anterior Nasal Swab  Result Value Ref Range Status   SARS Coronavirus 2 by RT PCR NEGATIVE NEGATIVE Final    Comment: (NOTE) SARS-CoV-2 target nucleic acids are NOT DETECTED.  The SARS-CoV-2 RNA is generally detectable in upper and lower respiratory specimens during the acute phase of infection. The lowest concentration of SARS-CoV-2 viral copies this assay can detect is 250 copies / mL. A negative result does not preclude SARS-CoV-2 infection and should not be used as the sole basis for treatment or other patient management decisions.  A negative result may occur with improper specimen collection / handling, submission of specimen other than nasopharyngeal swab, presence of viral mutation(s) within the areas targeted by this assay, and inadequate number of viral copies (<250 copies / mL). A negative result must be combined with clinical observations, patient history, and epidemiological information.  Fact Sheet for Patients:   https://www.patel.info/  Fact Sheet for Healthcare Providers: https://hall.com/  This test is not yet approved or  cleared by the [place] FDA and has been authorized for detection and/or diagnosis of SARS-CoV-2 by FDA under an Emergency Use Authorization (EUA).  This EUA will remain in effect (meaning this test can be used) for the duration of the COVID-19 declaration under Section 564(b)(1) of the Act, 21 U.S.C. section  360bbb-3(b)(1), unless the authorization is terminated or revoked sooner.  Performed at [facility], [address] [place], [place] [address]     Labs: CBC: Recent Labs  Lab 10/15/21 1232 10/16/21 0414  WBC 5.7 8.0  HGB 12.7 12.2  HCT 39.1 37.8  MCV 92.9 92.0  PLT 253 [id number]   Basic Metabolic Panel: Recent Labs  Lab 10/15/21 1232 10/16/21 0414  NA 140 141  K 4.3 3.8  CL 108 111  CO2 26 26  GLUCOSE 108* 85  BUN 16 19  CREATININE 1.01* 1.04*  CALCIUM 9.0 8.5*   Liver Function Tests: Recent Labs  Lab 10/15/21 1424  AST 25  ALT 17  ALKPHOS 73  BILITOT 0.5  PROT 6.9  ALBUMIN 3.7   CBG: No results for input(s): "GLUCAP" in the last 168 hours.  Discharge time spent: greater than 30 minutes.  Signed: Jennye Boroughs, MD [facility] 10/17/2021

## 2022-07-22 ENCOUNTER — Other Ambulatory Visit: Payer: Self-pay

## 2022-07-22 ENCOUNTER — Emergency Department
Admission: EM | Admit: 2022-07-22 | Discharge: 2022-07-22 | Disposition: A | Discharge status: Home / Self Care | Payer: Medicare PPO | Attending: Emergency Medicine | Admitting: Emergency Medicine

## 2022-07-22 DIAGNOSIS — R197 Diarrhea, unspecified: Secondary | ICD-10-CM | POA: Diagnosis present

## 2022-07-22 DIAGNOSIS — E860 Dehydration: Secondary | ICD-10-CM

## 2022-07-22 DIAGNOSIS — K529 Noninfective gastroenteritis and colitis, unspecified: Secondary | ICD-10-CM

## 2022-07-22 DIAGNOSIS — R42 Dizziness and giddiness: Secondary | ICD-10-CM

## 2022-07-22 LAB — URINALYSIS, ROUTINE W REFLEX MICROSCOPIC
Bacteria, UA: NONE SEEN
Bilirubin Urine: NEGATIVE
Glucose, UA: NEGATIVE mg/dL
Hgb urine dipstick: NEGATIVE
Ketones, ur: 5 mg/dL — AB
Nitrite: NEGATIVE
Protein, ur: NEGATIVE mg/dL
Specific Gravity, Urine: 1.008 (ref 1.005–1.030)
pH: 6 (ref 5.0–8.0)

## 2022-07-22 LAB — CBC
HCT: 44.5 % (ref 36.0–46.0)
Hemoglobin: 15 g/dL (ref 12.0–15.0)
MCH: 29.8 pg (ref 26.0–34.0)
MCHC: 33.7 g/dL (ref 30.0–36.0)
MCV: 88.5 fL (ref 80.0–100.0)
Platelets: 273 10*3/uL (ref 150–400)
RBC: 5.03 MIL/uL (ref 3.87–5.11)
RDW: 13.3 % (ref 11.5–15.5)
WBC: 10.7 10*3/uL — ABNORMAL HIGH (ref 4.0–10.5)
nRBC: 0 % (ref 0.0–0.2)

## 2022-07-22 LAB — BASIC METABOLIC PANEL
Anion gap: 10 (ref 5–15)
BUN: 15 mg/dL (ref 8–23)
CO2: 25 mmol/L (ref 22–32)
Calcium: 9.6 mg/dL (ref 8.9–10.3)
Chloride: 102 mmol/L (ref 98–111)
Creatinine, Ser: 1.15 mg/dL — ABNORMAL HIGH (ref 0.44–1.00)
GFR, Estimated: 49 mL/min — ABNORMAL LOW (ref >60)
Glucose, Bld: 100 mg/dL — ABNORMAL HIGH (ref 70–99)
Potassium: 4.7 mmol/L (ref 3.5–5.1)
Sodium: 137 mmol/L (ref 135–145)

## 2022-07-22 MED ORDER — SODIUM CHLORIDE 0.9 % IV BOLUS
1000.0000 mL | Freq: Once | INTRAVENOUS | Status: AC
  Administered 2022-07-22: 1000 mL via INTRAVENOUS

## 2022-07-22 NOTE — ED Notes (Signed)
Pt reports diarrhea for several months and weakness.  No chest pain or sob   pt also has pelvis pain and back pain per pt.  Pt taking meds for diarrhea with some relief. Pt alert  speech clear.

## 2022-07-22 NOTE — ED Triage Notes (Signed)
Pt comes with c/o diarrhea for several months and maybe dehydration. Pt states fatigue and weakness. Pt states her legs don't work.

## 2022-07-22 NOTE — ED Notes (Signed)
Pt told this RN that she did not want to go home due to her leg problems, RN informed ERP and ERP and pt talked and agreed on pt leaving. Pt wheeled to the waiting room.

## 2022-07-22 NOTE — ED Notes (Signed)
ERP notified of pt bradycardia, no new orders at this time

## 2022-07-22 NOTE — ED Notes (Signed)
Pt given lunch box, water, and crackers. Pt informed we need a stool sample to complete workup and encouraged to eat.

## 2022-07-22 NOTE — ED Provider Notes (Signed)
[facility] Provider Note   Event Date/Time   First MD Initiated Contact with Patient 07/22/22 1817     (approximate) History  Dehydration  HPI Sierra Dickerson is a 77 y.o. female with a stated past medical history of IBS who presents complaining of diarrhea for several months as well as dehydration.  Patient endorses fatigue and generalized weakness.  Patient endorses worsening dizziness when she stands as well as mild pain with urination. ROS: Patient currently denies any vision changes, tinnitus, difficulty speaking, facial droop, sore throat, chest pain, shortness of breath, abdominal pain, nausea/vomiting/diarrhea, or weakness/numbness/paresthesias in any extremity   Physical Exam  Triage Vital Signs: ED Triage Vitals  Enc Vitals Group     BP 07/22/22 1402 125/66     Pulse Rate 07/22/22 1402 63     Resp 07/22/22 1402 18     Temp 07/22/22 1402 (!) 97.5 F (36.4 C)     Temp Source 07/22/22 1824 Oral     SpO2 07/22/22 1402 100 %     Weight --      Height --      Head Circumference --      Peak Flow --      Pain Score 07/22/22 1401 5     Pain Loc --      Pain Edu? --      Excl. in GC? --    Most recent vital signs: Vitals:   07/22/22 1822 07/22/22 1824  BP:  (!) 158/58  Pulse: (!) 52 (!) 52  Resp: 15 15  Temp:  98 F (36.7 C)  SpO2: 100% 100%   General: Awake, oriented x4. CV:  Good peripheral perfusion.  Resp:  Normal effort.  Abd:  No distention.  Mild generalized lower abdominal tenderness palpation Other:  Overweight elderly Caucasian female laying in bed in no acute distress ED Results / Procedures / Treatments  Labs (all labs ordered are listed, but only abnormal results are displayed) Labs Reviewed  BASIC METABOLIC PANEL - Abnormal; Notable for the following components:      Result Value   Glucose, Bld 100 (*)    Creatinine, Ser 1.15 (*)    GFR, Estimated 49 (*)    All other components within normal limits  CBC - Abnormal;  Notable for the following components:   WBC 10.7 (*)    All other components within normal limits  GASTROINTESTINAL PANEL BY PCR, STOOL (REPLACES STOOL CULTURE)  URINALYSIS, ROUTINE W REFLEX MICROSCOPIC  CBG MONITORING, ED   EKG ED ECG REPORT I, Merwyn Katos, the attending physician, personally viewed and interpreted this ECG. Date: 07/22/2022 EKG Time: 1410 Rate: 61 Rhythm: normal sinus rhythm QRS Axis: normal Intervals: normal ST/T Wave abnormalities: normal Narrative Interpretation: no evidence of acute ischemia PROCEDURES: Critical Care performed: No .1-3 Lead EKG Interpretation  Performed by: Merwyn Katos, MD Authorized by: Merwyn Katos, MD     Interpretation: normal     ECG rate:  61   ECG rate assessment: normal     Rhythm: sinus rhythm     Ectopy: none     Conduction: normal    MEDICATIONS ORDERED IN ED: Medications  sodium chloride 0.9 % bolus 1,000 mL (1,000 mLs Intravenous New Bag/Given 07/22/22 1900)   IMPRESSION / MDM / ASSESSMENT AND PLAN / ED COURSE  I reviewed the triage vital signs and the nursing notes.  The patient is on the cardiac monitor to evaluate for evidence of arrhythmia and/or significant heart rate changes. Patient's presentation is most consistent with acute presentation with potential threat to life or bodily function. This patient presents with diarrhea consistent with likely viral enteritis. Doubt acute bacterial diarrhea. Considered, but think unlikely, partial SBO, appendicitis, diverticulitis, other intraabdominal infection. Low suspicion for secondary causes of diarrhea such as hyperadrenergic state, pheo, adrenal crisis, hyperthyroidism, or sepsis. Doubt antibiotic associated diarrhea.  Plan: PO rehydration, reassess, discharge with OTC antidiarrheal meds//short course antibiotics  Dispo: Discharge home with PCP follow-up and strict return precautions   FINAL CLINICAL IMPRESSION(S) / ED DIAGNOSES    Final diagnoses:  Chronic diarrhea  Dehydration  Orthostatic dizziness   Rx / DC Orders   ED Discharge Orders     None      Note:  This document was prepared using Dragon voice recognition software and may include unintentional dictation errors.   Merwyn Katos, MD 07/22/22 2009

## 2022-07-22 NOTE — ED Notes (Signed)
This RN assisted pt to bathroom, pt ambulated with steady gait although pt stating her legs "felt weird". Pt ambulated from the bed to the toilet back and forth x2. On the way back to the bed while this RN took a phone call pt started to feel weak, told this RN, Pt sat down on the floor, no injuries acquired. Pt then crawled onto hands and knees and got up all on her own and walked back to the bed, RN standy by assist. ERP notified. No new orders

## 2022-07-22 NOTE — Discharge Instructions (Addendum)
You may continue using loperamide for any continued diarrhea including 1 dose after every episode of diarrhea for up to 4 doses per day Please collect a stool sample and bring it to the intake desk of the hospital for evaluation

## 2022-07-22 NOTE — ED Notes (Signed)
Iv fluids infusing.  

## 2022-07-22 NOTE — ED Triage Notes (Signed)
First RN note:  Pt BIB [facility] from home for diarrhea x4 months with possible dehydration. EMS reports dizziness when she stands, as well as having pain with urination.   EMS vitals 136/73 52 cbg 97% RA
